# Patient Record
Sex: Male | Born: 1945
Health system: Southern US, Community
[De-identification: ages and names within clinical notes are randomized; demographics above are authoritative.]

## PROBLEM LIST (undated history)

## (undated) DIAGNOSIS — G44309 Post-traumatic headache, unspecified, not intractable: Secondary | ICD-10-CM

## (undated) DIAGNOSIS — S060X9A Concussion with loss of consciousness of unspecified duration, initial encounter: Secondary | ICD-10-CM

## (undated) DIAGNOSIS — S060XAA Concussion with loss of consciousness status unknown, initial encounter: Secondary | ICD-10-CM

## (undated) DIAGNOSIS — C801 Malignant (primary) neoplasm, unspecified: Secondary | ICD-10-CM

## (undated) DIAGNOSIS — F32A Depression, unspecified: Secondary | ICD-10-CM

## (undated) DIAGNOSIS — F0390 Unspecified dementia without behavioral disturbance: Secondary | ICD-10-CM

## (undated) DIAGNOSIS — F329 Major depressive disorder, single episode, unspecified: Secondary | ICD-10-CM

## (undated) DIAGNOSIS — R569 Unspecified convulsions: Secondary | ICD-10-CM

## (undated) HISTORY — PX: PROSTATECTOMY: SHX69

## (undated) HISTORY — DX: Unspecified convulsions: R56.9

## (undated) HISTORY — DX: Post-traumatic headache, unspecified, not intractable: G44.309

## (undated) HISTORY — DX: Concussion with loss of consciousness status unknown, initial encounter: S06.0XAA

## (undated) HISTORY — DX: Unspecified dementia, unspecified severity, without behavioral disturbance, psychotic disturbance, mood disturbance, and anxiety: F03.90

## (undated) HISTORY — DX: Malignant (primary) neoplasm, unspecified: C80.1

## (undated) HISTORY — DX: Concussion with loss of consciousness of unspecified duration, initial encounter: S06.0X9A

## (undated) HISTORY — PX: OTHER SURGICAL HISTORY: SHX169

---
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
## 2018-11-13 ENCOUNTER — Emergency Department (HOSPITAL_COMMUNITY)
Admission: EM | Admit: 2018-11-13 | Discharge: 2018-11-13 | Disposition: A | Discharge status: Home / Self Care | Payer: Medicare Other | Attending: Emergency Medicine | Admitting: Emergency Medicine

## 2018-11-13 ENCOUNTER — Emergency Department (HOSPITAL_COMMUNITY): Payer: Medicare Other

## 2018-11-13 ENCOUNTER — Encounter (HOSPITAL_COMMUNITY): Payer: Self-pay | Admitting: Emergency Medicine

## 2018-11-13 DIAGNOSIS — W0110XA Fall on same level from slipping, tripping and stumbling with subsequent striking against unspecified object, initial encounter: Secondary | ICD-10-CM | POA: Insufficient documentation

## 2018-11-13 DIAGNOSIS — Y999 Unspecified external cause status: Secondary | ICD-10-CM | POA: Diagnosis not present

## 2018-11-13 DIAGNOSIS — Y929 Unspecified place or not applicable: Secondary | ICD-10-CM | POA: Diagnosis not present

## 2018-11-13 DIAGNOSIS — W19XXXA Unspecified fall, initial encounter: Secondary | ICD-10-CM

## 2018-11-13 DIAGNOSIS — S0181XA Laceration without foreign body of other part of head, initial encounter: Secondary | ICD-10-CM | POA: Diagnosis not present

## 2018-11-13 DIAGNOSIS — Z23 Encounter for immunization: Secondary | ICD-10-CM | POA: Diagnosis not present

## 2018-11-13 DIAGNOSIS — Y939 Activity, unspecified: Secondary | ICD-10-CM | POA: Diagnosis not present

## 2018-11-13 DIAGNOSIS — S0990XA Unspecified injury of head, initial encounter: Secondary | ICD-10-CM | POA: Diagnosis present

## 2018-11-13 MED ORDER — TETANUS-DIPHTH-ACELL PERTUSSIS 5-2.5-18.5 LF-MCG/0.5 IM SUSP
0.5000 mL | Freq: Once | INTRAMUSCULAR | Status: AC
  Administered 2018-11-13: 0.5 mL via INTRAMUSCULAR
  Filled 2018-11-13: qty 0.5

## 2018-11-13 MED ORDER — LIDOCAINE HCL (PF) 1 % IJ SOLN
2.0000 mL | Freq: Once | INTRAMUSCULAR | Status: AC
  Administered 2018-11-13: 2 mL
  Filled 2018-11-13: qty 30

## 2018-11-13 NOTE — ED Provider Notes (Addendum)
[facility] [facility] DEPT Provider Note   CSN: [id number] Arrival date & time: 11/13/18  2133     History   Chief Complaint Chief Complaint  Patient presents with  . Fall  . Alcohol Intoxication    HPI Robert D Bozich [name]. is a 73 y.o. male.  The history is provided by the patient and medical records. No language interpreter was used.  Fall   Alcohol Intoxication    Robert D Anfinson [name]. is a 73 y.o. male who presents to the Emergency Department for evaluation after a fall just prior to arrival.  Patient states that today is the anniversary of his wife's passing.  He drank several glasses of Chardonnay and became unsteady on his feet.  He did fall and struck the side of his head.  He was wearing his glasses and believe that his glasses caused a laceration.  He is unsure of his last tetanus status.  Does not believe he lost consciousness.  No nausea or vomiting.  Denies any other injuries or arthralgias/myalgias.  No neck or back pain.  No numbness, tingling or weakness.  History reviewed. No pertinent past medical history.  There are no active problems to display for this patient.   History reviewed. No pertinent surgical history.      Home Medications    Prior to Admission medications   Not on File    Family History History reviewed. No pertinent family history.  Social History Social History   Tobacco Use  . Smoking status: Not on file  Substance Use Topics  . Alcohol use: Not on file  . Drug use: Not on file     Allergies   Patient has no allergy information on record.   Review of Systems Review of Systems  Skin: Positive for wound.  All other systems reviewed and are negative.    Physical Exam Updated Vital Signs BP 117/74 (BP Location: Right Arm)   Pulse 85   Temp 98.7 F (37.1 C) (Oral)   Resp 19   SpO2 93%   Physical Exam Vitals signs and nursing note reviewed.  Constitutional:      General: He is not in  acute distress.    Appearance: He is well-developed.  HENT:     Head: Normocephalic.     Comments: 1.5 cm laceration to the right temporal area Neck:     Comments: No midline tenderness. Cardiovascular:     Rate and Rhythm: Normal rate and regular rhythm.     Heart sounds: Normal heart sounds. No murmur.  Pulmonary:     Effort: Pulmonary effort is normal. No respiratory distress.     Breath sounds: Normal breath sounds.  Abdominal:     General: There is no distension.     Palpations: Abdomen is soft.     Tenderness: There is no abdominal tenderness.  Skin:    General: Skin is warm and dry.  Neurological:     Mental Status: He is alert and oriented to person, place, and time.     Comments: CN 2-12 grossly intact.  No drift. Strength and sensation intact.       ED Treatments / Results  Labs (all labs ordered are listed, but only abnormal results are displayed) Labs Reviewed - No data to display  EKG None  Radiology Ct Head Wo Contrast  Result Date: 11/13/2018 CLINICAL DATA:  73 year old who fell and struck his head on his front door, sustained a laceration to the forehead. No loss  of consciousness. Patient admits to 3 glasses of wine. Initial encounter. EXAM: CT HEAD WITHOUT CONTRAST CT CERVICAL SPINE WITHOUT CONTRAST TECHNIQUE: Multidetector CT imaging of the head and cervical spine was performed following the standard protocol without intravenous contrast. Multiplanar CT image reconstructions of the cervical spine were also generated. COMPARISON:  None. FINDINGS: CT HEAD FINDINGS Brain: Subcutaneous metallic device (electrodes?) in the bilateral frontal regions causes metallic beam hardening streak artifact over the frontal lobes. Mild age-appropriate cortical atrophy. Ventricular system normal in size and appearance for age. No mass lesion. No midline shift. No acute hemorrhage or hematoma. No extra-axial fluid collections. No evidence of acute infarction. Vascular: Minimal  BILATERAL carotid siphon atherosclerosis. No hyperdense vessel. Skull: Solitary mixed lucent and sclerotic lesion involving the RIGHT frontal bone. No skull fractures and no focal osseous abnormality elsewhere involving the skull. Sinuses/Orbits: Visualized paranasal sinuses, bilateral mastoid air cells and bilateral middle ear cavities well-aerated. Visualized orbits and globes normal in appearance. Other: None. CT CERVICAL SPINE FINDINGS Alignment: Anatomic alignment. Facet joints anatomically aligned throughout with diffuse degenerative changes. Skull base and vertebrae: No fractures identified involving the cervical spine. Coronal reformatted images demonstrate an intact craniocervical junction, intact dens and intact lateral masses throughout. Soft tissues and spinal canal: No evidence of paraspinous or spinal canal hematoma. No evidence of spinal stenosis. Disc levels: Moderate disc space narrowing at C5-6. Uncinate and facet hypertrophy account for multilevel foraminal stenoses including moderate LEFT C3-4, severe BILATERAL C4-5, severe BILATERAL C5-6, moderate LEFT C6-7. Upper chest: Visualized lung apices clear. Visualized superior mediastinum normal. Other: None. IMPRESSION: 1. No acute intracranial abnormality. 2. No cervical spine fractures identified. 3. Multilevel degenerative disc disease, spondylosis and facet degenerative changes with multilevel foraminal stenoses as detailed above. Electronically Signed   By: Evangeline Dakin M.D.   On: 11/13/2018 22:21   Ct Cervical Spine Wo Contrast  Result Date: 11/13/2018 CLINICAL DATA:  73 year old who fell and struck his head on his front door, sustained a laceration to the forehead. No loss of consciousness. Patient admits to 3 glasses of wine. Initial encounter. EXAM: CT HEAD WITHOUT CONTRAST CT CERVICAL SPINE WITHOUT CONTRAST TECHNIQUE: Multidetector CT imaging of the head and cervical spine was performed following the standard protocol without  intravenous contrast. Multiplanar CT image reconstructions of the cervical spine were also generated. COMPARISON:  None. FINDINGS: CT HEAD FINDINGS Brain: Subcutaneous metallic device (electrodes?) in the bilateral frontal regions causes metallic beam hardening streak artifact over the frontal lobes. Mild age-appropriate cortical atrophy. Ventricular system normal in size and appearance for age. No mass lesion. No midline shift. No acute hemorrhage or hematoma. No extra-axial fluid collections. No evidence of acute infarction. Vascular: Minimal BILATERAL carotid siphon atherosclerosis. No hyperdense vessel. Skull: Solitary mixed lucent and sclerotic lesion involving the RIGHT frontal bone. No skull fractures and no focal osseous abnormality elsewhere involving the skull. Sinuses/Orbits: Visualized paranasal sinuses, bilateral mastoid air cells and bilateral middle ear cavities well-aerated. Visualized orbits and globes normal in appearance. Other: None. CT CERVICAL SPINE FINDINGS Alignment: Anatomic alignment. Facet joints anatomically aligned throughout with diffuse degenerative changes. Skull base and vertebrae: No fractures identified involving the cervical spine. Coronal reformatted images demonstrate an intact craniocervical junction, intact dens and intact lateral masses throughout. Soft tissues and spinal canal: No evidence of paraspinous or spinal canal hematoma. No evidence of spinal stenosis. Disc levels: Moderate disc space narrowing at C5-6. Uncinate and facet hypertrophy account for multilevel foraminal stenoses including moderate LEFT C3-4, severe BILATERAL C4-5, severe BILATERAL  C5-6, moderate LEFT C6-7. Upper chest: Visualized lung apices clear. Visualized superior mediastinum normal. Other: None. IMPRESSION: 1. No acute intracranial abnormality. 2. No cervical spine fractures identified. 3. Multilevel degenerative disc disease, spondylosis and facet degenerative changes with multilevel foraminal  stenoses as detailed above. Electronically Signed   By: Evangeline Dakin M.D.   On: 11/13/2018 22:21    Procedures .Marland KitchenLaceration Repair Date/Time: 11/13/2018 11:09 PM Performed by: Ward, Ozella Almond, PA-C Authorized by: Ward, Ozella Almond, PA-C   Consent:    Consent obtained:  Verbal   Consent given by:  Patient   Risks discussed:  Pain, infection, poor cosmetic result and poor wound healing Anesthesia (see MAR for exact dosages):    Anesthesia method:  None Laceration details:    Location:  Face   Face location:  Forehead   Length (cm):  1.5 Repair type:    Repair type:  Simple Pre-procedure details:    Preparation:  Patient was prepped and draped in usual sterile fashion Exploration:    Hemostasis achieved with:  Direct pressure   Wound exploration: wound explored through full range of motion and entire depth of wound probed and visualized   Treatment:    Area cleansed with:  Saline   Amount of cleaning:  Standard   Irrigation solution:  Sterile saline Skin repair:    Repair method:  Sutures   Suture size:  5-0   Wound skin closure material used: Vicryl Rapide.   Suture technique:  Simple interrupted   Number of sutures:  2 Approximation:    Approximation:  Close Post-procedure details:    Dressing:  Open (no dressing)   Patient tolerance of procedure:  Tolerated well, no immediate complications   (including critical care time)  Medications Ordered in ED Medications  lidocaine (PF) (XYLOCAINE) 1 % injection 2 mL (has no administration in time range)  Tdap (BOOSTRIX) injection 0.5 mL (0.5 mLs Intramuscular Given 11/13/18 2225)     Initial Impression / Assessment and Plan / ED Course  I have reviewed the triage vital signs and the nursing notes.  Pertinent labs & imaging results that were available during my care of the patient were reviewed by me and considered in my medical decision making (see chart for details).    Robert D Whetsel [name]. is a 73 y.o. male  who presents to ED for valuation after fall secondary to alcohol abuse today.  No cranial nerve deficits.  CT head and cervical spine without acute findings.  Laceration repaired as dictated above.  Tetanus updated. Evaluation does not show pathology that would require ongoing emergent intervention or inpatient treatment. Home care instructions / return precautions / follow up care discussed and included in discharge summary. Brother at bedside who will drive patient home and stay with him tonight. All questions answered.   Patient seen by and discussed with Dr. Gilford Raid who agrees with treatment plan.    Final Clinical Impressions(s) / ED Diagnoses   Final diagnoses:  Fall, initial encounter  Injury of head, initial encounter  Facial laceration, initial encounter    ED Discharge Orders    None         Ward, Ozella Almond, PA-C 11/13/18 2311    Isla Pence, MD 11/14/18 1502

## 2018-11-13 NOTE — ED Notes (Signed)
Bed: WA03 Expected date:  Expected time:  Means of arrival:  Comments: EMS: 73 y/o fall, ETOH no blood thinners

## 2018-11-13 NOTE — Discharge Instructions (Signed)
It was my pleasure taking care of you today!   Fortunately, your images were reassuring with no signs of injury from your fall.  As we discussed, I have repaired your laceration with dissolvable stitches.  They should dissolve in the next 7 days.  If they do not, please take a warm washcloth and gently rub the area with Vaseline.  If they still have not come out and 7 to 10 days, please see your doctor/urgent care/emergency department for suture removal.  Keep the wound clean and dry.  Monitor for signs of infection such as redness around the site.  Return to ER for signs of infection to the wound, new or worsening symptoms, any additional concerns.

## 2018-11-13 NOTE — ED Triage Notes (Signed)
Per EMS , pt. From [facility] at [facility] with complaint of  etoh and fall at 0830 this evening  Pt. Admitted to drinking of 3 glasses of wine in celebration his anniversary. [place] in the front door as he opened the door for the med tech, witnessed. Acquired abrasion to right forehead ,no LOC. Second fall witnessed by EMS as pt. Putting on his pants in  front of them. Denied of blood thinner. A/O x4. c-collar in placed.

## 2019-06-18 ENCOUNTER — Emergency Department (HOSPITAL_COMMUNITY): Payer: Medicare Other

## 2019-06-18 ENCOUNTER — Inpatient Hospital Stay (HOSPITAL_COMMUNITY): Payer: Medicare Other

## 2019-06-18 ENCOUNTER — Encounter (HOSPITAL_COMMUNITY): Payer: Self-pay | Admitting: Emergency Medicine

## 2019-06-18 ENCOUNTER — Other Ambulatory Visit: Payer: Self-pay

## 2019-06-18 ENCOUNTER — Inpatient Hospital Stay (HOSPITAL_COMMUNITY)
Admission: EM | Admit: 2019-06-18 | Discharge: 2019-06-23 | DRG: 100 | Disposition: A | Discharge status: Home / Self Care | Payer: Medicare Other | Attending: Internal Medicine | Admitting: Internal Medicine

## 2019-06-18 DIAGNOSIS — R402132 Coma scale, eyes open, to sound, at arrival to emergency department: Secondary | ICD-10-CM | POA: Diagnosis present

## 2019-06-18 DIAGNOSIS — R402362 Coma scale, best motor response, obeys commands, at arrival to emergency department: Secondary | ICD-10-CM | POA: Diagnosis present

## 2019-06-18 DIAGNOSIS — F1010 Alcohol abuse, uncomplicated: Secondary | ICD-10-CM | POA: Diagnosis present

## 2019-06-18 DIAGNOSIS — D696 Thrombocytopenia, unspecified: Secondary | ICD-10-CM | POA: Diagnosis not present

## 2019-06-18 DIAGNOSIS — E876 Hypokalemia: Secondary | ICD-10-CM | POA: Diagnosis not present

## 2019-06-18 DIAGNOSIS — R402232 Coma scale, best verbal response, inappropriate words, at arrival to emergency department: Secondary | ICD-10-CM | POA: Diagnosis present

## 2019-06-18 DIAGNOSIS — J9602 Acute respiratory failure with hypercapnia: Secondary | ICD-10-CM | POA: Diagnosis present

## 2019-06-18 DIAGNOSIS — Z881 Allergy status to other antibiotic agents status: Secondary | ICD-10-CM | POA: Diagnosis not present

## 2019-06-18 DIAGNOSIS — Z79890 Hormone replacement therapy (postmenopausal): Secondary | ICD-10-CM

## 2019-06-18 DIAGNOSIS — Z7951 Long term (current) use of inhaled steroids: Secondary | ICD-10-CM | POA: Diagnosis not present

## 2019-06-18 DIAGNOSIS — E039 Hypothyroidism, unspecified: Secondary | ICD-10-CM | POA: Diagnosis present

## 2019-06-18 DIAGNOSIS — R55 Syncope and collapse: Secondary | ICD-10-CM | POA: Diagnosis not present

## 2019-06-18 DIAGNOSIS — Z87820 Personal history of traumatic brain injury: Secondary | ICD-10-CM | POA: Diagnosis not present

## 2019-06-18 DIAGNOSIS — G40901 Epilepsy, unspecified, not intractable, with status epilepticus: Secondary | ICD-10-CM | POA: Diagnosis present

## 2019-06-18 DIAGNOSIS — Z20828 Contact with and (suspected) exposure to other viral communicable diseases: Secondary | ICD-10-CM | POA: Diagnosis present

## 2019-06-18 DIAGNOSIS — E785 Hyperlipidemia, unspecified: Secondary | ICD-10-CM | POA: Diagnosis present

## 2019-06-18 DIAGNOSIS — F329 Major depressive disorder, single episode, unspecified: Secondary | ICD-10-CM | POA: Diagnosis present

## 2019-06-18 DIAGNOSIS — R569 Unspecified convulsions: Secondary | ICD-10-CM | POA: Diagnosis not present

## 2019-06-18 DIAGNOSIS — G40909 Epilepsy, unspecified, not intractable, without status epilepticus: Secondary | ICD-10-CM

## 2019-06-18 DIAGNOSIS — J9601 Acute respiratory failure with hypoxia: Secondary | ICD-10-CM | POA: Diagnosis present

## 2019-06-18 DIAGNOSIS — R4182 Altered mental status, unspecified: Secondary | ICD-10-CM | POA: Diagnosis present

## 2019-06-18 DIAGNOSIS — Z885 Allergy status to narcotic agent status: Secondary | ICD-10-CM

## 2019-06-18 DIAGNOSIS — G8929 Other chronic pain: Secondary | ICD-10-CM | POA: Diagnosis present

## 2019-06-18 DIAGNOSIS — Z4659 Encounter for fitting and adjustment of other gastrointestinal appliance and device: Secondary | ICD-10-CM

## 2019-06-18 HISTORY — DX: Depression, unspecified: F32.A

## 2019-06-18 LAB — GLUCOSE, CAPILLARY: Glucose-Capillary: 123 mg/dL — ABNORMAL HIGH (ref 70–99)

## 2019-06-18 LAB — CBC
HCT: 41.4 % (ref 39.0–52.0)
Hemoglobin: 14.5 g/dL (ref 13.0–17.0)
MCH: 33.6 pg (ref 26.0–34.0)
MCHC: 35 g/dL (ref 30.0–36.0)
MCV: 95.8 fL (ref 80.0–100.0)
Platelets: 168 10*3/uL (ref 150–400)
RBC: 4.32 MIL/uL (ref 4.22–5.81)
RDW: 12.1 % (ref 11.5–15.5)
WBC: 11.1 10*3/uL — ABNORMAL HIGH (ref 4.0–10.5)
nRBC: 0 % (ref 0.0–0.2)

## 2019-06-18 LAB — SALICYLATE LEVEL: Salicylate Lvl: <7 mg/dL (ref 2.8–30.0)

## 2019-06-18 LAB — POCT I-STAT 7, (LYTES, BLD GAS, ICA,H+H)
Acid-base deficit: 9 mmol/L — ABNORMAL HIGH (ref 0.0–2.0)
Acid-base deficit: 2 mmol/L (ref 0.0–2.0)
Bicarbonate: 20.1 mmol/L (ref 20.0–28.0)
Bicarbonate: 22.2 mmol/L (ref 20.0–28.0)
Calcium, Ion: 1.2 mmol/L (ref 1.15–1.40)
Calcium, Ion: 1.16 mmol/L (ref 1.15–1.40)
HCT: 43 % (ref 39.0–52.0)
HCT: 39 % (ref 39.0–52.0)
Hemoglobin: 14.6 g/dL (ref 13.0–17.0)
Hemoglobin: 13.3 g/dL (ref 13.0–17.0)
O2 Saturation: 96 %
O2 Saturation: 100 %
Patient temperature: 98.6
Patient temperature: 99.5
Potassium: 4.1 mmol/L (ref 3.5–5.1)
Potassium: 3.6 mmol/L (ref 3.5–5.1)
Sodium: 140 mmol/L (ref 135–145)
Sodium: 140 mmol/L (ref 135–145)
TCO2: 22 mmol/L (ref 22–32)
TCO2: 23 mmol/L (ref 22–32)
pCO2 arterial: 57.9 mmHg — ABNORMAL HIGH (ref 32.0–48.0)
pCO2 arterial: 37.6 mmHg (ref 32.0–48.0)
pH, Arterial: 7.149 — CL (ref 7.350–7.450)
pH, Arterial: 7.382 (ref 7.350–7.450)
pO2, Arterial: 107 mmHg (ref 83.0–108.0)
pO2, Arterial: 223 mmHg — ABNORMAL HIGH (ref 83.0–108.0)

## 2019-06-18 LAB — CBC WITH DIFFERENTIAL/PLATELET
Abs Immature Granulocytes: 0.1 10*3/uL — ABNORMAL HIGH (ref 0.00–0.07)
Basophils Absolute: 0.1 10*3/uL (ref 0.0–0.1)
Basophils Relative: 1 %
Eosinophils Absolute: 0.1 10*3/uL (ref 0.0–0.5)
Eosinophils Relative: 1 %
HCT: 45.2 % (ref 39.0–52.0)
Hemoglobin: 15.4 g/dL (ref 13.0–17.0)
Immature Granulocytes: 1 %
Lymphocytes Relative: 35 %
Lymphs Abs: 2.8 10*3/uL (ref 0.7–4.0)
MCH: 33.3 pg (ref 26.0–34.0)
MCHC: 34.1 g/dL (ref 30.0–36.0)
MCV: 97.8 fL (ref 80.0–100.0)
Monocytes Absolute: 0.8 10*3/uL (ref 0.1–1.0)
Monocytes Relative: 10 %
Neutro Abs: 4 10*3/uL (ref 1.7–7.7)
Neutrophils Relative %: 52 %
Platelets: 196 10*3/uL (ref 150–400)
RBC: 4.62 MIL/uL (ref 4.22–5.81)
RDW: 12.1 % (ref 11.5–15.5)
WBC: 7.9 10*3/uL (ref 4.0–10.5)
nRBC: 0 % (ref 0.0–0.2)

## 2019-06-18 LAB — POCT I-STAT EG7
Acid-base deficit: 5 mmol/L — ABNORMAL HIGH (ref 0.0–2.0)
Bicarbonate: 19.3 mmol/L — ABNORMAL LOW (ref 20.0–28.0)
Calcium, Ion: 1.1 mmol/L — ABNORMAL LOW (ref 1.15–1.40)
HCT: 44 % (ref 39.0–52.0)
Hemoglobin: 15 g/dL (ref 13.0–17.0)
O2 Saturation: 95 %
Potassium: 4.1 mmol/L (ref 3.5–5.1)
Sodium: 139 mmol/L (ref 135–145)
TCO2: 20 mmol/L — ABNORMAL LOW (ref 22–32)
pCO2, Ven: 32.5 mmHg — ABNORMAL LOW (ref 44.0–60.0)
pH, Ven: 7.381 (ref 7.250–7.430)
pO2, Ven: 79 mmHg — ABNORMAL HIGH (ref 32.0–45.0)

## 2019-06-18 LAB — URINALYSIS, ROUTINE W REFLEX MICROSCOPIC
Bilirubin Urine: NEGATIVE
Glucose, UA: NEGATIVE mg/dL
Ketones, ur: NEGATIVE mg/dL
Leukocytes,Ua: NEGATIVE
Nitrite: NEGATIVE
Protein, ur: 100 mg/dL — AB
Specific Gravity, Urine: 1.013 (ref 1.005–1.030)
pH: 5 (ref 5.0–8.0)

## 2019-06-18 LAB — CBG MONITORING, ED: Glucose-Capillary: 111 mg/dL — ABNORMAL HIGH (ref 70–99)

## 2019-06-18 LAB — COMPREHENSIVE METABOLIC PANEL
ALT: 33 U/L (ref 0–44)
AST: 44 U/L — ABNORMAL HIGH (ref 15–41)
Albumin: 4.2 g/dL (ref 3.5–5.0)
Alkaline Phosphatase: 74 U/L (ref 38–126)
Anion gap: 16 — ABNORMAL HIGH (ref 5–15)
BUN: 14 mg/dL (ref 8–23)
CO2: 18 mmol/L — ABNORMAL LOW (ref 22–32)
Calcium: 9 mg/dL (ref 8.9–10.3)
Chloride: 103 mmol/L (ref 98–111)
Creatinine, Ser: 1.18 mg/dL (ref 0.61–1.24)
GFR calc Af Amer: >60 mL/min (ref >60)
GFR calc non Af Amer: >60 mL/min (ref >60)
Glucose, Bld: 131 mg/dL — ABNORMAL HIGH (ref 70–99)
Potassium: 4.1 mmol/L (ref 3.5–5.1)
Sodium: 137 mmol/L (ref 135–145)
Total Bilirubin: 0.9 mg/dL (ref 0.3–1.2)
Total Protein: 6.8 g/dL (ref 6.5–8.1)

## 2019-06-18 LAB — LIPASE, BLOOD: Lipase: 40 U/L (ref 11–51)

## 2019-06-18 LAB — RAPID URINE DRUG SCREEN, HOSP PERFORMED
Amphetamines: NOT DETECTED
Barbiturates: NOT DETECTED
Benzodiazepines: NOT DETECTED
Cocaine: NOT DETECTED
Opiates: NOT DETECTED
Tetrahydrocannabinol: NOT DETECTED

## 2019-06-18 LAB — ACETAMINOPHEN LEVEL: Acetaminophen (Tylenol), Serum: <10 ug/mL — ABNORMAL LOW (ref 10–30)

## 2019-06-18 LAB — SARS CORONAVIRUS 2 BY RT PCR (HOSPITAL ORDER, PERFORMED IN ~~LOC~~ HOSPITAL LAB): SARS Coronavirus 2: NEGATIVE

## 2019-06-18 LAB — PROTIME-INR
INR: 1 (ref 0.8–1.2)
Prothrombin Time: 13 seconds (ref 11.4–15.2)

## 2019-06-18 LAB — PROCALCITONIN: Procalcitonin: <0.1 ng/mL

## 2019-06-18 LAB — MAGNESIUM: Magnesium: 2.3 mg/dL (ref 1.7–2.4)

## 2019-06-18 LAB — APTT: aPTT: 29 seconds (ref 24–36)

## 2019-06-18 LAB — ETHANOL: Alcohol, Ethyl (B): <10 mg/dL (ref <10)

## 2019-06-18 LAB — PHOSPHORUS: Phosphorus: 2.2 mg/dL — ABNORMAL LOW (ref 2.5–4.6)

## 2019-06-18 LAB — TROPONIN I (HIGH SENSITIVITY)
Troponin I (High Sensitivity): 6 ng/L (ref <18)
Troponin I (High Sensitivity): 49 ng/L — ABNORMAL HIGH (ref <18)

## 2019-06-18 LAB — TSH: TSH: 3.129 u[IU]/mL (ref 0.350–4.500)

## 2019-06-18 LAB — LACTIC ACID, PLASMA: Lactic Acid, Venous: 2.6 mmol/L (ref 0.5–1.9)

## 2019-06-18 MED ORDER — ETOMIDATE 2 MG/ML IV SOLN
INTRAVENOUS | Status: AC | PRN
  Administered 2019-06-18: 20 mg via INTRAVENOUS

## 2019-06-18 MED ORDER — ROCURONIUM BROMIDE 50 MG/5ML IV SOLN
INTRAVENOUS | Status: AC | PRN
  Administered 2019-06-18: 90 mg via INTRAVENOUS

## 2019-06-18 MED ORDER — FENTANYL BOLUS VIA INFUSION
25.0000 ug | INTRAVENOUS | Status: DC | PRN
  Filled 2019-06-18: qty 25

## 2019-06-18 MED ORDER — TIMOLOL MALEATE 0.5 % OP SOLN
1.0000 [drp] | Freq: Two times a day (BID) | OPHTHALMIC | Status: DC
  Administered 2019-06-18 – 2019-06-23 (×9): 1 [drp] via OPHTHALMIC
  Filled 2019-06-18 (×2): qty 5

## 2019-06-18 MED ORDER — LEVOTHYROXINE SODIUM 75 MCG PO TABS
37.5000 ug | ORAL_TABLET | Freq: Every day | ORAL | Status: DC
  Administered 2019-06-19 – 2019-06-23 (×3): 37.5 ug via ORAL
  Filled 2019-06-18 (×3): qty 1

## 2019-06-18 MED ORDER — FENTANYL CITRATE (PF) 100 MCG/2ML IJ SOLN: 25.0000 ug | Freq: Once | INTRAMUSCULAR | Status: DC

## 2019-06-18 MED ORDER — ORAL CARE MOUTH RINSE
15.0000 mL | OROMUCOSAL | Status: DC
  Administered 2019-06-18 – 2019-06-20 (×19): 15 mL via OROMUCOSAL

## 2019-06-18 MED ORDER — MIDAZOLAM HCL 2 MG/2ML IJ SOLN
1.0000 mg | INTRAMUSCULAR | Status: DC | PRN
  Administered 2019-06-20: 1 mg via INTRAVENOUS
  Filled 2019-06-18: qty 2

## 2019-06-18 MED ORDER — FOLIC ACID 5 MG/ML IJ SOLN
1.0000 mg | Freq: Every day | INTRAMUSCULAR | Status: AC
  Administered 2019-06-18 – 2019-06-20 (×3): 1 mg via INTRAVENOUS
  Filled 2019-06-18 (×3): qty 0.2

## 2019-06-18 MED ORDER — SODIUM CHLORIDE 0.9 % IV BOLUS
1000.0000 mL | Freq: Once | INTRAVENOUS | Status: AC
  Administered 2019-06-18: 15:00:00 1000 mL via INTRAVENOUS

## 2019-06-18 MED ORDER — ATORVASTATIN CALCIUM 10 MG PO TABS
20.0000 mg | ORAL_TABLET | Freq: Every day | ORAL | Status: DC
  Administered 2019-06-18 – 2019-06-21 (×3): 20 mg
  Filled 2019-06-18 (×3): qty 2

## 2019-06-18 MED ORDER — SODIUM CHLORIDE 0.9 % IV SOLN: 1.5000 g | Freq: Three times a day (TID) | INTRAVENOUS | Status: DC

## 2019-06-18 MED ORDER — BRIMONIDINE TARTRATE 0.2 % OP SOLN
1.0000 [drp] | Freq: Two times a day (BID) | OPHTHALMIC | Status: DC
  Administered 2019-06-18 – 2019-06-23 (×9): 1 [drp] via OPHTHALMIC
  Filled 2019-06-18 (×2): qty 5

## 2019-06-18 MED ORDER — LEVETIRACETAM IN NACL 500 MG/100ML IV SOLN
500.0000 mg | Freq: Two times a day (BID) | INTRAVENOUS | Status: DC
  Administered 2019-06-19 – 2019-06-23 (×9): 500 mg via INTRAVENOUS
  Filled 2019-06-18 (×9): qty 100

## 2019-06-18 MED ORDER — ENOXAPARIN SODIUM 40 MG/0.4ML ~~LOC~~ SOLN
40.0000 mg | SUBCUTANEOUS | Status: DC
  Administered 2019-06-18 – 2019-06-22 (×5): 40 mg via SUBCUTANEOUS
  Filled 2019-06-18 (×5): qty 0.4

## 2019-06-18 MED ORDER — LORAZEPAM 2 MG/ML IJ SOLN
INTRAMUSCULAR | Status: AC
  Administered 2019-06-18: 15:00:00
  Filled 2019-06-18: qty 1

## 2019-06-18 MED ORDER — MIDAZOLAM HCL 2 MG/2ML IJ SOLN: 1.0000 mg | INTRAMUSCULAR | Status: DC | PRN

## 2019-06-18 MED ORDER — PROPOFOL 1000 MG/100ML IV EMUL
INTRAVENOUS | Status: AC
  Filled 2019-06-18: qty 100

## 2019-06-18 MED ORDER — SODIUM CHLORIDE 0.9 % IV SOLN
2000.0000 mg | INTRAVENOUS | Status: AC
  Administered 2019-06-18: 18:00:00 2000 mg via INTRAVENOUS
  Filled 2019-06-18: qty 20

## 2019-06-18 MED ORDER — ONDANSETRON HCL 4 MG/2ML IJ SOLN
4.0000 mg | Freq: Once | INTRAMUSCULAR | Status: AC
  Administered 2019-06-18: 4 mg via INTRAVENOUS
  Filled 2019-06-18: qty 2

## 2019-06-18 MED ORDER — ATORVASTATIN CALCIUM 10 MG PO TABS: 20.0000 mg | ORAL_TABLET | Freq: Every day | ORAL | Status: DC

## 2019-06-18 MED ORDER — MONTELUKAST SODIUM 10 MG PO TABS
10.0000 mg | ORAL_TABLET | Freq: Every day | ORAL | Status: DC
  Administered 2019-06-18: 10 mg via ORAL
  Filled 2019-06-18: qty 1

## 2019-06-18 MED ORDER — CHLORHEXIDINE GLUCONATE 0.12% ORAL RINSE (MEDLINE KIT)
15.0000 mL | Freq: Two times a day (BID) | OROMUCOSAL | Status: DC
  Administered 2019-06-18 – 2019-06-20 (×4): 15 mL via OROMUCOSAL

## 2019-06-18 MED ORDER — FENTANYL 2500MCG IN NS 250ML (10MCG/ML) PREMIX INFUSION
25.0000 ug/h | INTRAVENOUS | Status: DC
  Administered 2019-06-18: 50 ug/h via INTRAVENOUS
  Administered 2019-06-19: 75 ug/h via INTRAVENOUS
  Filled 2019-06-18 (×2): qty 250

## 2019-06-18 MED ORDER — SODIUM CHLORIDE 0.9 % IV SOLN
3.0000 g | Freq: Four times a day (QID) | INTRAVENOUS | Status: DC
  Administered 2019-06-18 – 2019-06-20 (×8): 3 g via INTRAVENOUS
  Filled 2019-06-18 (×2): qty 8
  Filled 2019-06-18 (×5): qty 3
  Filled 2019-06-18: qty 8
  Filled 2019-06-18: qty 3

## 2019-06-18 MED ORDER — FAMOTIDINE 40 MG/5ML PO SUSR
20.0000 mg | Freq: Two times a day (BID) | ORAL | Status: DC
  Administered 2019-06-18 – 2019-06-20 (×4): 20 mg
  Filled 2019-06-18 (×5): qty 2.5

## 2019-06-18 MED ORDER — ADULT MULTIVITAMIN LIQUID CH
15.0000 mL | Freq: Every day | ORAL | Status: DC
  Administered 2019-06-18 – 2019-06-20 (×3): 15 mL via ORAL
  Filled 2019-06-18 (×3): qty 15

## 2019-06-18 MED ORDER — PROPOFOL 1000 MG/100ML IV EMUL
5.0000 ug/kg/min | INTRAVENOUS | Status: DC
  Administered 2019-06-18: 18:00:00 60 ug/kg/min via INTRAVENOUS
  Administered 2019-06-18: 23:00:00 30 ug/kg/min via INTRAVENOUS
  Administered 2019-06-18: 15:00:00 10 ug/kg/min via INTRAVENOUS
  Administered 2019-06-19 (×2): 60 ug/kg/min via INTRAVENOUS
  Administered 2019-06-19: 55 ug/kg/min via INTRAVENOUS
  Administered 2019-06-20 (×2): 60 ug/kg/min via INTRAVENOUS
  Filled 2019-06-18: qty 200
  Filled 2019-06-18 (×5): qty 100
  Filled 2019-06-18: qty 200
  Filled 2019-06-18 (×2): qty 100

## 2019-06-18 MED ORDER — THIAMINE HCL 100 MG/ML IJ SOLN
100.0000 mg | INTRAMUSCULAR | Status: AC
  Administered 2019-06-19 – 2019-06-20 (×2): 100 mg via INTRAVENOUS
  Filled 2019-06-18 (×3): qty 2

## 2019-06-18 NOTE — ED Triage Notes (Signed)
Pt arrives via EMS from [facility] with reports of possible seizure today. Staff reports pt became gray and right arm stiffened. Facility reports pt was normal this AM for breakfast. Pt altered and startled when touched. EMS reports pt has been having HA the past few days but pt denies HA at this time.

## 2019-06-18 NOTE — Progress Notes (Signed)
Pharmacy Antibiotic Note  Robert Cummings. is a 73 y.o. male admitted on 06/18/2019 with aspiration PNA.  Pharmacy has been consulted for Unasyn dosing. Scr 1.18 on admit. Noted hx of "unspecified" reaction to Keflex, brother reportedly could not confirm allergy. Ok to trial Unasyn per discussion with Dr. Valeta Harms.  Plan: Unasyn 3g IV q6h Monitor clinical progress, c/s, renal function F/u de-escalation plan/LOT   Height: 5\' 8"  (172.7 cm) Weight: 176 lb 14.4 oz (80.2 kg) IBW/kg (Calculated) : 68.4  Temp (24hrs), Avg:97.5 F (36.4 C), Min:97.5 F (36.4 C), Max:97.5 F (36.4 C)  Recent Labs  Lab 06/18/19 1425  WBC 7.9  CREATININE 1.18    Estimated Creatinine Clearance: 55.6 mL/min (by C-G formula based on SCr of 1.18 mg/dL).    Allergies  Allergen Reactions  . Cephalexin Other (See Comments)    Brother cannot confirm  . Ciprofloxacin Other (See Comments)    Brother cannot confirm  . Codeine Other (See Comments)    Brother cannot confirm  . Other Other (See Comments)    Ragweed- Runny nose, itchy eyes, and congestion also    Elicia Lamp, PharmD, BCPS Please check AMION for all [facility] contact numbers Clinical Pharmacist 06/18/2019 4:45 PM

## 2019-06-18 NOTE — Procedures (Addendum)
ELECTROENCEPHALOGRAM REPORT   Patient: Robert Cummings.       Room #: 4N21C EEG No. ID: [id number] Age: 73 y.o.        Sex: male Referring Physician: Halford Chessman Report Date:  06/18/2019        Interpreting Physician: Alexis Goodell  History: Robert Cummings Robert Cummings. is an 73 y.o. male with sudden loss of consciousness and right arm shaking  Medications:  Propofol, Keppra, Synthroid, Lipitor, Unasyn, Fentanyl, Singulair, MVI  Conditions of Recording:  This is a 21 channel routine scalp EEG performed with bipolar and monopolar montages arranged in accordance to the international 10/20 system of electrode placement. One channel was dedicated to EKG recording.  The patient is in the sedated state.  There is a stimulator located at the forehead.  Description:  The background activity is slow and poorly organized.  It consists of a mixture of theta and delta activity that is low voltage.  Some occasional intermixed alpha activity is noted as well.  There are bursts of beta activity that occur intermittently over the left hemisphere. The patient has some clinical episodes of right hand shaking lasting a few seconds each.  These do not correspond to the episodes of beta activity which are likely artifact from the implanted stimulator.  The patient has noxious stimuli applied.  There is activation of the background with this stimulation.   Hyperventilation and intermittent photic stimulation were not performed.   IMPRESSION: This is an abnormal EEG secondary to general background slowing.  This finding may be seen with a diffuse disturbance that is etiologically nonspecific, but may include a metabolic encephalopathy, among other possibilities.  There are bursts of beta activity over the left hemisphere that may very well be artifactual and related to the patient's stimulator.     Alexis Goodell, MD Neurology [phone] 06/18/2019, 5:44 PM

## 2019-06-18 NOTE — H&P (Signed)
NAME:  Robert Cummings, [place] MRN:  [id number], DOB:  May 27, 1946, LOS: 0 ADMISSION DATE:  06/18/2019, CONSULTATION DATE:  06/18/2019 REFERRING MD:  Dr. Billy Fischer, ER, CHIEF COMPLAINT: seizure  Brief History   73 yo male from [place] with seizure.  Required intubation for airway protection in ER.  Had c/o headache recently.  Hx from chart and ER staff.  Pt unable to provide Hx.  Past Medical History  HLD, Allergies, Hypothyroidism, Depression  Significant Hospital Events   8/15 Admit  Consults:  Neurology 8/15 seizure  Procedures:  ETT 8/15 >>   Significant Diagnostic Tests:  CT head 8/15 >> no acute findings  Micro Data:  COVID 8/15 >> negative Sputum 8/15 >>   Antimicrobials:  Unasyn 8/15 >>   Interim history/subjective:    Objective   Blood pressure 118/86, pulse (!) 113, temperature (!) 97.5 F (36.4 C), temperature source Temporal, resp. rate 18, height 5\' 8"  (1.727 m), weight 80.2 kg, SpO2 97 %.    Vent Mode: PRVC FiO2 (%):  [100 %] 100 % Set Rate:  [16 bmp-24 bmp] 24 bmp Vt Set:  [540 mL] 540 mL PEEP:  [10 cmH20] 10 cmH20 Plateau Pressure:  [19 cmH20-21 cmH20] 21 cmH20   Intake/Output Summary (Last 24 hours) at 06/18/2019 1633 Last data filed at 06/18/2019 1530 Gross per 24 hour  Intake 1000 ml  Output -  Net 1000 ml   Filed Weights   06/18/19 1408  Weight: 80.2 kg    Examination:  General - sedated Eyes - pupils reactive ENT - ETT in place Cardiac - regular, tachycardic, no murmur Chest - b/l crackles Abdomen - soft, non tender, + bowel sounds Extremities - no cyanosis, clubbing, or edema Skin - no rashes Neuro - RASS -2, subcutaneous stimulator in forehead  CXR - low lung volumes, elevated Rt diaphragm, b/l ASD/ATX (reviewed by me)   Resolved Hospital Problem list     Assessment & Plan:   Acute hypoxic, hypercapnic respiratory failure with compromised airway. - full vent support - adjust PEEP/FiO2 to keep  SpO2 > 92% - f/u CXR  Acute metabolic encephalopathy. Seizure with reported hx of ETOH. - neurology consulted - f/u EEG - continue keppra - RASS goal -1 - thiamine, folic acid  Aspiration pneumonitis. - f/u sputum culture - start unasyn  Hx of HLD. - continue lipitor  Hx of hypothyroidism. - continue synthroid - check TSH  Hx of depression. - hold outpt zoloft  Hx of allergies. - continue singulair - hold zyrtec  Best practice:  Diet: NPO DVT prophylaxis: lovenox GI prophylaxis: pepcid Mobility: bed rest Code Status: full code Disposition: ICU  Labs   CBC: Recent Labs  Lab 06/18/19 1425 06/18/19 1435 06/18/19 1544  WBC 7.9  --   --   NEUTROABS 4.0  --   --   HGB 15.4 15.0 14.6  HCT 45.2 44.0 43.0  MCV 97.8  --   --   PLT 196  --   --     Basic Metabolic Panel: Recent Labs  Lab 06/18/19 1425 06/18/19 1435 06/18/19 1544  NA 137 139 140  K 4.1 4.1 4.1  CL 103  --   --   CO2 18*  --   --   GLUCOSE 131*  --   --   BUN 14  --   --   CREATININE 1.18  --   --   CALCIUM 9.0  --   --  GFR: Estimated Creatinine Clearance: 55.6 mL/min (by C-G formula based on SCr of 1.18 mg/dL). Recent Labs  Lab 06/18/19 1425  WBC 7.9    Liver Function Tests: Recent Labs  Lab 06/18/19 1425  AST 44*  ALT 33  ALKPHOS 74  BILITOT 0.9  PROT 6.8  ALBUMIN 4.2   Recent Labs  Lab 06/18/19 1425  LIPASE 40   No results for input(s): AMMONIA in the last 168 hours.  ABG    Component Value Date/Time   PHART 7.149 (LL) 06/18/2019 1544   PCO2ART 57.9 (H) 06/18/2019 1544   PO2ART 107.0 06/18/2019 1544   HCO3 20.1 06/18/2019 1544   TCO2 22 06/18/2019 1544   ACIDBASEDEF 9.0 (H) 06/18/2019 1544   O2SAT 96.0 06/18/2019 1544     Coagulation Profile: No results for input(s): INR, PROTIME in the last 168 hours.  Cardiac Enzymes: No results for input(s): CKTOTAL, CKMB, CKMBINDEX, TROPONINI in the last 168 hours.  HbA1C: No results found for: HGBA1C  CBG:  Recent Labs  Lab 06/18/19 1408  GLUCAP 111*    Review of Systems:   Unable to obtain  Past Medical History  He,  has a past medical history of Depression.   Surgical History   Unable to obtain  Social History  Unable to obtain  Family History   Unable to obtain  Allergies Allergies  Allergen Reactions  . Cephalexin Other (See Comments)    Brother cannot confirm  . Ciprofloxacin Other (See Comments)    Brother cannot confirm  . Codeine Other (See Comments)    Brother cannot confirm  . Other Other (See Comments)    Ragweed- Runny nose, itchy eyes, and congestion also     Home Medications  Prior to Admission medications   Medication Sig Start Date End Date Taking? Authorizing Provider  acetaminophen (MAPAP) 325 MG tablet Take 650 mg by mouth daily.   Yes [provider]  brimonidine (ALPHAGAN) 0.2 % ophthalmic solution Place 1 drop into both eyes 2 (two) times daily. 05/27/19  Yes [provider]  levothyroxine (SYNTHROID) 25 MCG tablet Take 37.5 mcg by mouth daily before breakfast.   Yes [provider]  loperamide (IMODIUM A-D) 2 MG tablet Take 2 mg by mouth 4 (four) times daily as needed for diarrhea or loose stools.   Yes [provider]  Multiple Vitamin (MULTIVITAMIN WITH MINERALS) TABS tablet Take 1 tablet by mouth daily.   Yes [provider]  ondansetron (ZOFRAN) 4 MG tablet Take 4 mg by mouth every 8 (eight) hours as needed for nausea or vomiting.   Yes [provider]  timolol (TIMOPTIC) 0.5 % ophthalmic solution Place 1 drop into both eyes 2 (two) times daily. 12/21/17  Yes [provider]  traZODone (DESYREL) 50 MG tablet Take 25 mg by mouth at bedtime.   Yes [provider]  valACYclovir (VALTREX) 500 MG tablet Take 500 mg by mouth daily.   Yes [provider]  sertraline (ZOLOFT) 50 MG tablet Take 75 mg by mouth every morning. 03/22/19   [provider]     Critical  care time: 75 minutes    Chesley Mires, MD [place] 06/18/2019, 4:49 PM

## 2019-06-18 NOTE — ED Provider Notes (Addendum)
°[facility] EMERGENCY DEPARTMENT °Provider Note ° ° °CSN: [id number] °Arrival date & time: 06/18/19  1358 ° ° ° °History   °Chief Complaint °Chief Complaint  °Patient presents with  °• Altered Mental Status  ° ° °HPI °Gradie D Fuster Jr. is a 73 y.o. male. ° °  ° °HPI  ° °73yo male with history of depression presents from [facility] who presents with concern for episode of syncope with possible seizure-like activity.  Per facility, patient appeared normal at breakfast, then was seen walking down the hallway and was seen with right side stiffening, turning purple and falling to the ground.  Report he was out. On EMS arrival he was awake and following commands without signs of focal deficit, was startingling easily and altered.  Glucose and VS normal. ° °Patient answering questions.Per facility, he had been complaining of headache and per EMS had one episode of emesis. Pt reports no headache now, no cough, no fever, no chest pain or dyspnea.  ° ° °Per brother from hx from Dr. Arora, pt does have hx of etoh use, prior seizure and has device in forehead [employer] for "monitoring"/chronic pain. Is on chronic pain regimen.  ° °Past Medical History:  °Diagnosis Date  °• Depression   ° ° °Patient Active Problem List  ° Diagnosis Date Noted  °• Seizure (HCC) 06/18/2019  ° ° °No past surgical history on file.  ° ° ° ° °Home Medications   ° °Prior to Admission medications   °Medication Sig Start Date End Date Taking? Authorizing Provider  °acetaminophen (MAPAP) 325 MG tablet Take 650 mg by mouth daily.   Yes [provider]  °atorvastatin (LIPITOR) 20 MG tablet Take 20 mg by mouth at bedtime.   Yes [provider]  °Azelastine HCl 0.15 % SOLN Place 2 sprays into both nostrils 2 (two) times daily.  05/25/19  Yes [provider]  °brimonidine (ALPHAGAN) 0.2 % ophthalmic solution Place 1 drop into both eyes 2 (two) times daily. 05/27/19  Yes [provider]  °cetirizine (ZYRTEC) 10 MG tablet Take 10 mg by mouth daily.   Yes [provider]  °fluticasone (FLONASE) 50 MCG/ACT nasal spray Place 2 sprays into both nostrils daily.   Yes [provider]  °levothyroxine (SYNTHROID) 25 MCG tablet Take 37.5 mcg by mouth daily before breakfast.   Yes [provider]  °loperamide (IMODIUM A-D) 2 MG tablet Take 2 mg by mouth 4 (four) times daily as needed for diarrhea or loose stools.   Yes [provider]  °montelukast (SINGULAIR) 10 MG tablet Take 10 mg by mouth at bedtime.   Yes [provider]  °Multiple Vitamin (MULTIVITAMIN WITH MINERALS) TABS tablet Take 1 tablet by mouth daily.   Yes [provider]  °ondansetron (ZOFRAN) 4 MG tablet Take 4 mg by mouth every 8 (eight) hours as needed for nausea or vomiting.   Yes [provider]  °sertraline (ZOLOFT) 50 MG tablet Take 75 mg by mouth every morning. 03/22/19  Yes [provider]  °timolol (TIMOPTIC) 0.5 % ophthalmic solution Place 1 drop into both eyes 2 (two) times daily. 12/21/17  Yes [provider]  °traZODone (DESYREL) 50 MG tablet Take 25 mg by mouth at bedtime.   Yes [provider]  °valACYclovir (VALTREX) 500 MG tablet Take 500 mg by mouth daily.   Yes [provider]  ° ° °Family History °No family history on file. ° °Social History °Social History  ° °  Tobacco Use  °• Smoking status: Not on file  °Substance Use Topics  °• Alcohol use: Not on file  °• Drug use: Not on file  ° ° ° °Allergies   °Cephalexin, Ciprofloxacin, Codeine, and Other ° ° °Review of Systems °Review of Systems  °Unable to perform ROS: Mental status change  °Constitutional: Negative for fever.  °Respiratory: Negative for cough and shortness of breath.   °Gastrointestinal: Positive for nausea and vomiting.  °Neurological: Positive for seizures and headaches.  ° ° ° °Physical Exam °Updated Vital Signs °BP 123/77    Pulse 97    Temp 98.5 °F  (36.9 °C) (Axillary)    Resp (!) 24    Ht 5' 8" (1.727 m)    Wt 80.2 kg    SpO2 98%    BMI 26.90 kg/m²  ° °Physical Exam °Vitals signs and nursing note reviewed.  °Constitutional:   °   General: He is not in acute distress. °   Appearance: He is well-developed. He is not diaphoretic.  °HENT:  °   Head: Normocephalic and atraumatic.  °Eyes:  °   Extraocular Movements: Extraocular movements intact.  °   Conjunctiva/sclera: Conjunctivae normal.  °   Pupils: Pupils are equal, round, and reactive to light.  °Neck:  °   Musculoskeletal: Normal range of motion.  °Cardiovascular:  °   Rate and Rhythm: Normal rate and regular rhythm.  °   Heart sounds: Normal heart sounds. No murmur. No friction rub. No gallop.   °Pulmonary:  °   Effort: Pulmonary effort is normal. No respiratory distress.  °   Breath sounds: Normal breath sounds. No wheezing or rales.  °Abdominal:  °   General: There is no distension.  °   Palpations: Abdomen is soft.  °   Tenderness: There is no abdominal tenderness. There is no guarding.  °Skin: °   General: Skin is warm and dry.  °Neurological:  °   Mental Status: He is alert.  °   GCS: GCS eye subscore is 4. GCS verbal subscore is 5. GCS motor subscore is 6.  °   Cranial Nerves: No cranial nerve deficit, dysarthria or facial asymmetry.  °   Sensory: Sensation is intact.  °   Motor: Motor function is intact. No weakness or pronator drift.  °   Coordination: Coordination normal.  °   Comments: Oriented to self-"says Denn Sabater" °Intermittently answering questions and following commands °Normal bilateral upper and lower extremity strength °Difficulty understanding finger to nose but able to perform °Has right sided gaze preference but able to look towards left. When asked if touching right or left he does not state name but will shake the correct leg in response.   °Startles easily when asking questions  ° ° ° ° °ED Treatments / Results  °Labs °(all labs ordered are listed, but only abnormal results  are displayed) °Labs Reviewed  °CBC WITH DIFFERENTIAL/PLATELET - Abnormal; Notable for the following components:  °    Result Value  ° Abs Immature Granulocytes 0.10 (*)   ° All other components within normal limits  °COMPREHENSIVE METABOLIC PANEL - Abnormal; Notable for the following components:  ° CO2 18 (*)   ° Glucose, Bld 131 (*)   ° AST 44 (*)   ° Anion gap 16 (*)   ° All other components within normal limits  °ACETAMINOPHEN LEVEL - Abnormal; Notable for the following components:  ° Acetaminophen (Tylenol), Serum <10 (*)   ° All other   components within normal limits  °URINALYSIS, ROUTINE W REFLEX MICROSCOPIC - Abnormal; Notable for the following components:  ° Hgb urine dipstick MODERATE (*)   ° Protein, ur 100 (*)   ° Bacteria, UA RARE (*)   ° All other components within normal limits  °CBC - Abnormal; Notable for the following components:  ° WBC 11.1 (*)   ° All other components within normal limits  °PHOSPHORUS - Abnormal; Notable for the following components:  ° Phosphorus 2.2 (*)   ° All other components within normal limits  °LACTIC ACID, PLASMA - Abnormal; Notable for the following components:  ° Lactic Acid, Venous 2.6 (*)   ° All other components within normal limits  °GLUCOSE, CAPILLARY - Abnormal; Notable for the following components:  ° Glucose-Capillary 123 (*)   ° All other components within normal limits  °CBG MONITORING, ED - Abnormal; Notable for the following components:  ° Glucose-Capillary 111 (*)   ° All other components within normal limits  °POCT I-STAT EG7 - Abnormal; Notable for the following components:  ° pCO2, Ven 32.5 (*)   ° pO2, Ven 79.0 (*)   ° Bicarbonate 19.3 (*)   ° TCO2 20 (*)   ° Acid-base deficit 5.0 (*)   ° Calcium, Ion 1.10 (*)   ° All other components within normal limits  °POCT I-STAT 7, (LYTES, BLD GAS, ICA,H+H) - Abnormal; Notable for the following components:  ° pH, Arterial 7.149 (*)   ° pCO2 arterial 57.9 (*)   ° Acid-base deficit 9.0 (*)   ° All other components  within normal limits  °POCT I-STAT 7, (LYTES, BLD GAS, ICA,H+H) - Abnormal; Notable for the following components:  ° pO2, Arterial 223.0 (*)   ° All other components within normal limits  °TROPONIN I (HIGH SENSITIVITY) - Abnormal; Notable for the following components:  ° Troponin I (High Sensitivity) 49 (*)   ° All other components within normal limits  °SARS CORONAVIRUS 2 (HOSPITAL ORDER, PERFORMED IN [facility])  °CULTURE, RESPIRATORY  °MRSA PCR SCREENING  °LIPASE, BLOOD  °SALICYLATE LEVEL  °ETHANOL  °RAPID URINE DRUG SCREEN, HOSP PERFORMED  °MAGNESIUM  °PROCALCITONIN  °PROTIME-INR  °APTT  °TSH  °BLOOD GAS, ARTERIAL  °CBC  °MAGNESIUM  °PHOSPHORUS  °BLOOD GAS, ARTERIAL  °BLOOD GAS, ARTERIAL  °TROPONIN I (HIGH SENSITIVITY)  ° ° °EKG °EKG Interpretation ° °Date/Time:  Saturday June 18 2019 14:04:05 EDT °Ventricular Rate:  106 °PR Interval:    °QRS Duration: 123 °QT Interval:  362 °QTC Calculation: 481 °R Axis:   -52 °Text Interpretation:  Sinus tachycardia Left bundle branch block No previous ECGs available Confirmed by ,  ([id number]) on 06/18/2019 10:00:00 PM ° ° °Radiology °Ct Head Wo Contrast ° °Result Date: 06/18/2019 °CLINICAL DATA:  Altered mental status. EXAM: CT HEAD WITHOUT CONTRAST TECHNIQUE: Contiguous axial images were obtained from the base of the skull through the vertex without intravenous contrast. COMPARISON:  None. FINDINGS: Brain: Generalized parenchymal volume loss with commensurate dilatation of the ventricles and sulci. There is no mass, hemorrhage, edema or other evidence of acute parenchymal abnormality. No extra-axial hemorrhage. Vascular: No hyperdense vessel or unexpected calcification. Skull: No acute findings. Presumed stimulator apparatus overlying the frontal bone. Sinuses/Orbits: Small amount of fluid within the sphenoid sinus. Mild mucosal thickening within the ethmoid air cells. Remainder of the paranasal sinuses are clear. Other: None. IMPRESSION: No acute  intracranial abnormality. No intracranial mass, hemorrhage or edema. Electronically Signed   By: Stan  Maynard M.D.     On: 06/18/2019 15:17  ° °Dg Chest Portable 1 View ° °Result Date: 06/18/2019 °CLINICAL DATA:  Seizures.  Intubation and enteric tube placement. EXAM: PORTABLE CHEST 1 VIEW COMPARISON:  None. FINDINGS: Endotracheal tube tip 2.4 cm above the carina. Enteric tube entering the stomach with the tip below the field of view. The heart size and mediastinal contours are within normal limits. Normal pulmonary vascularity. Low lung volumes with bibasilar atelectasis. No focal consolidation, pleural effusion, or pneumothorax. No acute osseous abnormality. Left chest wall stimulator noted with leads coursing superiorly into the left neck. IMPRESSION: 1. Appropriately positioned endotracheal tube. 2. Low lung volumes with bibasilar atelectasis. Electronically Signed   By: William T Derry M.D.   On: 06/18/2019 15:10  ° °Dg Abd Portable 1v ° °Result Date: 06/18/2019 °CLINICAL DATA:  Seizures.  Enteric tube placement. EXAM: PORTABLE ABDOMEN - 1 VIEW COMPARISON:  None. FINDINGS: Enteric tube tip in the distal stomach. The bowel gas pattern is normal. No radio-opaque calculi or other significant radiographic abnormality are seen. Bibasilar atelectasis. No acute osseous abnormality. IMPRESSION: 1. Enteric tube tip in the distal stomach. Electronically Signed   By: William T Derry M.D.   On: 06/18/2019 15:11  ° ° °Procedures °.Critical Care °Performed by: , , MD °Authorized by: , , MD  ° °Critical care provider statement:  °  Critical care time (minutes):  45 °  Critical care was necessary to treat or prevent imminent or life-threatening deterioration of the following conditions:  CNS failure or compromise °  Critical care was time spent personally by me on the following activities:  Discussions with consultants, evaluation of patient's response to treatment, examination of patient, ordering and  performing treatments and interventions, ordering and review of laboratory studies, ordering and review of radiographic studies, pulse oximetry, re-evaluation of patient's condition, obtaining history from patient or surrogate and review of old charts ° °Date/Time: 06/18/2019 10:02 PM °Performed by: , , MD °Pre-anesthesia Checklist: Patient identified °Oxygen Delivery Method: Ambu bag °Induction Type: Rapid sequence °Ventilation: Nasal airway inserted- appropriate to patient size °Laryngoscope Size: Glidescope °Grade View: Grade I °Tube size: 7.5 mm °Number of attempts: 1 °Secured at: 23 cm °Tube secured with: ETT holder °Comments: glidescope with cric pressure to pass tube ° ° ° ° ° (including critical care time) ° °Medications Ordered in ED °Medications  °propofol (DIPRIVAN) 1000 MG/100ML infusion (70 mcg/kg/min × 80.2 kg Intravenous Rate/Dose Verify 06/18/19 1800)  °enoxaparin (LOVENOX) injection 40 mg (40 mg Subcutaneous Given 06/18/19 1755)  °famotidine (PEPCID) 40 MG/5ML suspension 20 mg (20 mg Per Tube Given 06/18/19 2102)  °fentaNYL (SUBLIMAZE) injection 25 mcg ( Intravenous Canceled Entry 06/18/19 1645)  °fentaNYL 2500mcg in NS 250mL (10mcg/ml) infusion-PREMIX (50 mcg/hr Intravenous Rate/Dose Verify 06/18/19 1800)  °fentaNYL (SUBLIMAZE) bolus via infusion 25 mcg (has no administration in time range)  °midazolam (VERSED) injection 1 mg (has no administration in time range)  °midazolam (VERSED) injection 1 mg (has no administration in time range)  °thiamine (B-1) injection 100 mg (has no administration in time range)  °multivitamin liquid 15 mL (15 mLs Oral Given 06/18/19 1838)  °folic acid injection 1 mg (1 mg Intravenous Given 06/18/19 1833)  °Ampicillin-Sulbactam (UNASYN) 3 g in sodium chloride 0.9 % 100 mL IVPB (3 g Intravenous New Bag/Given 06/18/19 1833)  °brimonidine (ALPHAGAN) 0.2 % ophthalmic solution 1 drop (1 drop Both Eyes Given 06/18/19 2104)  °levothyroxine (SYNTHROID) tablet 37.5 mcg (has  no administration in time range)  °montelukast (SINGULAIR) tablet 10 mg (10 mg Oral   Given 06/18/19 2102)  °timolol (TIMOPTIC) 0.5 % ophthalmic solution 1 drop (1 drop Both Eyes Given 06/18/19 2103)  °atorvastatin (LIPITOR) tablet 20 mg (20 mg Per Tube Given 06/18/19 2102)  °levETIRAcetam (KEPPRA) IVPB 500 mg/100 mL premix (has no administration in time range)  °chlorhexidine gluconate (MEDLINE KIT) (PERIDEX) 0.12 % solution 15 mL (15 mLs Mouth Rinse Given 06/18/19 1940)  °MEDLINE mouth rinse (15 mLs Mouth Rinse Given 06/18/19 2108)  °ondansetron (ZOFRAN) injection 4 mg (4 mg Intravenous Given 06/18/19 1424)  °sodium chloride 0.9 % bolus 1,000 mL (0 mLs Intravenous Stopped 06/18/19 1530)  °LORazepam (ATIVAN) 2 MG/ML injection (  Given 06/18/19 1432)  °etomidate (AMIDATE) injection (20 mg Intravenous Given 06/18/19 1441)  °rocuronium (ZEMURON) injection (90 mg Intravenous Given 06/18/19 1441)  °levETIRAcetam (KEPPRA) 2,000 mg in sodium chloride 0.9 % 100 mL IVPB ( Intravenous Rate/Dose Verify 06/18/19 1800)  ° ° ° °Initial Impression / Assessment and Plan / ED Course  °I have reviewed the triage vital signs and the nursing notes. ° °Pertinent labs & imaging results that were available during my care of the patient were reviewed by me and considered in my medical decision making (see chart for details). ° °  ° °  ° °73yo male with history of depression presents from [facility] who presents with concern for episode of syncope with possible seizure-like activity.  On arrival to the ED, he is altered without focal deficits.  Planned for emergent CT, tox labs, and called Neurology Dr. Arora emergently given acute change in mental status although no clear focal deficits and discussion of possible Code Stroke.  Immediately after my discussion with Dr. Arora and immediately before transport to CT, I walked into the room and patient began having right sided gaze deviation and right arm shaking. Initially he was able to  answer me when I walked into the room and was awake during this episode and called Dr. Arora regarding focal seizure, however soon after this the patient became unresponsive with generalized tonic clonic activity and was given IV ativan.  Total episode lasted approximately 1 minute. Given concern for EMS report of headache, pt with emesis and now focal to generalized seizure with concern for need for emergent head CT, pt not able to protect airway, he was intubated for airway protection and transported emergently for CT.  He was given ativan and keppra. ° °CT head without acute abnormalities.  Patient hooked up to LTM. No significant metabolic abnormalities on labs, no sign of ingestions.  Consider possible etoh withdrawal versus other underlying seizure disorder.  Patient admitted to ICU with Neurology following.  ° °Final Clinical Impressions(s) / ED Diagnoses  ° °Final diagnoses:  °Altered mental status, unspecified altered mental status type  °Seizures (HCC)  ° ° °ED Discharge Orders   ° None  °  ° °  ° °  °, , MD °06/18/19 2203 ° °

## 2019-06-18 NOTE — ED Notes (Signed)
Pt seizing

## 2019-06-18 NOTE — Progress Notes (Signed)
LTM started Dr Rory Percy notified

## 2019-06-18 NOTE — Consult Note (Signed)
Neurology Consultation  Reason for Consult: Sudden onset of loss of consciousness and fall, seizure Referring Physician: Dr. Gareth Morgan  CC: Sudden loss of consciousness and fall and seizure  History is obtained from: Chart review-limited history in the chart, call with the brother who also could only provide limited history.  HPI: Robert Cummings. is a 73 y.o. male past medical history of depression and questionable history of alcohol dependence and one seizure many years ago along with a history of concussion 15 years ago according to the brother, residing in an independent living facility at [facility], was brought in when staff noted that patient became gray after a sudden fall.  Has right arm stiffening, he went to the ground and had a possible seizure.  He was brought into the emergency room, evaluated.  I was called for an emergent consultation and by the time I reached the patient's bedside a couple minutes after being called, the patient had already had an episode of vomiting, a focal seizure which was described as starting from his right arm ascending up and then involving the whole body. He was having difficulty protecting his airway and was being bagged.There is been no report of loss of pulse.  He was ultimately intubated.  He was taken in for a noncontrast CT of the head which was essentially unremarkable for any acute process.  At baseline, the patient lives in independent living but has multiple memory issues more so with short-term memory and executive function.  His brother said that the patient is exceptionally vulnerable and gets taken advantage of.  He said he had a concussion 15 years ago.  There was probably a seizure around that time as well.  When I asked if the patient seizure was associated with alcohol or drugs he said it probably would have been alcohol indicating towards a possible alcohol abuse history.  He is not able to confirm as the patient currently abuses  alcohol.  LKW: Unclear tpa given?: no, not stroke-likely seizure Premorbid modified Rankin scale (mRS):2  ROS: Unable to obtain due to altered mental status Past Medical History:  Diagnosis Date   Depression     No family history on file. Patient unable to provide due to altered mental status  Social History:   has no history on file for tobacco, alcohol, and drug. Patient unable to provide due to altered mental status  Medications  Current Facility-Administered Medications:    etomidate (AMIDATE) injection, , Intravenous, PRN, Gareth Morgan, MD, 20 mg at 06/18/19 1441   propofol (DIPRIVAN) 1000 MG/100ML infusion, 5-80 mcg/kg/min, Intravenous, Continuous, Schlossman, Erin, MD, Last Rate: 4.81 mL/hr at 06/18/19 1449, 10 mcg/kg/min at 06/18/19 1449   rocuronium (ZEMURON) injection, , Intravenous, PRN, Gareth Morgan, MD, 90 mg at 06/18/19 1441 No current outpatient medications on file.  Based on my phone call with the facility, which then directed me to living well at home-[phone]-the medication list as below: Trazodone Valtrex Acetaminophen Prednisone 20 mg Zoloft Singulair Zyrtec Nasal spray  Exam: Current vital signs: BP (!) 157/99    Pulse (!) 129    Temp (!) 97.5 F (36.4 C) (Temporal)    Resp 16    Ht 5\' 8"  (1.727 m)    Wt 80.2 kg    SpO2 96%    BMI 26.90 kg/m  Vital signs in last 24 hours: Temp:  [97.5 F (36.4 C)] 97.5 F (36.4 C) (08/15 1404) Pulse Rate:  [73-150] 129 (08/15 1525) Resp:  [16-38] 16 (  08/15 1525) BP: (150-203)/(95-129) 157/99 (08/15 1525) SpO2:  [76 %-96 %] 96 % (08/15 1525) FiO2 (%):  [100 %] 100 % (08/15 1557) Weight:  [80.2 kg] 80.2 kg (08/15 1408)  General: Not on any sedation   Labs I have reviewed labs in epic and the results pertinent to this consultation are:  CBC    Component Value Date/Time   WBC 7.9 06/18/2019 1425   RBC 4.62 06/18/2019 1425   HGB 14.6 06/18/2019 1544   HCT 43.0 06/18/2019 1544   PLT 196  06/18/2019 1425   MCV 97.8 06/18/2019 1425   MCH 33.3 06/18/2019 1425   MCHC 34.1 06/18/2019 1425   RDW 12.1 06/18/2019 1425   LYMPHSABS 2.8 06/18/2019 1425   MONOABS 0.8 06/18/2019 1425   EOSABS 0.1 06/18/2019 1425   BASOSABS 0.1 06/18/2019 1425    CMP     Component Value Date/Time   NA 140 06/18/2019 1544   K 4.1 06/18/2019 1544   CL 103 06/18/2019 1425   CO2 18 (L) 06/18/2019 1425   GLUCOSE 131 (H) 06/18/2019 1425   BUN 14 06/18/2019 1425   CREATININE 1.18 06/18/2019 1425   CALCIUM 9.0 06/18/2019 1425   PROT 6.8 06/18/2019 1425   ALBUMIN 4.2 06/18/2019 1425   AST 44 (H) 06/18/2019 1425   ALT 33 06/18/2019 1425   ALKPHOS 74 06/18/2019 1425   BILITOT 0.9 06/18/2019 1425   GFRNONAA >60 06/18/2019 1425   GFRAA >60 06/18/2019 1425    Imaging I have reviewed the images obtained:  CT-scan of the brain-no acute changes Has some sort of a stimulator which the family does not have details of     Assessment: 73 year old past history of questionable alcohol dependence, depression, one seizure in the past, no history of any antiepileptic medications, brought in with a possible seizure and then had another seizure in the emergency room requiring emergent intubation. The seizures reported to be having started from his right arm and then generalized. Questionable history of alcohol abuse  Impression Focal seizure with secondary generalization?  New onset versus alcohol withdrawal Status epilepticus  Recommendations: IV Ativan Start Keppra 2 g IV. Stat EEG Supportive management per ICU Attempt to obtain more history as well as more information about the stimulator and on his head. We will continue to follow with you.  -- Amie Portland, MD [facility] Pager: [phone] If 7pm to 7am, please call on call as listed on AMION.  CRITICAL CARE ATTESTATION Performed by: Amie Portland, MD Total critical care time: 40 minutes Critical care time was exclusive of  separately billable procedures and treating other patients and/or supervising APPs/Residents/Students Critical care was necessary to treat or prevent imminent or life-threatening deterioration due to seizure, status epilepticus  This patient is critically ill and at significant risk for neurological worsening and/or death and care requires constant monitoring. Critical care was time spent personally by me on the following activities: development of treatment plan with patient and/or surrogate as well as nursing, discussions with consultants, evaluation of patient's response to treatment, examination of patient, obtaining history from patient or surrogate, ordering and performing treatments and interventions, ordering and review of laboratory studies, ordering and review of radiographic studies, pulse oximetry, re-evaluation of patient's condition, participation in multidisciplinary rounds and medical decision making of high complexity in the care of this patient.  Addendum Patient got his EEG done.  Preliminary review does not show status epilepticus.  Suggestive of severe encephalopathy. Chest x-ray shows the generator for the stimulator  seen on head CT.  Still unclear what this device is. Chest x-ray also reveals moderate to severe aspiration pneumonitis.    Updated recommendations: Will hook up to LTM EEG because unclear why he seized. Continue Keppra 500 twice daily Management of aspiration pneumonitis per primary team as you are.  We will continue to follow  -- Amie Portland, MD [facility] Pager: [phone] If 7pm to 7am, please call on call as listed on AMION.

## 2019-06-18 NOTE — Progress Notes (Signed)
RT note: RT and RN transported patient on Ventilator to CT and back to ED. Vital signs stable through out.

## 2019-06-18 NOTE — Progress Notes (Signed)
Pt admitted with belongings at bedside (pants, shirt, belt, shoes, socks, glasses).

## 2019-06-18 NOTE — Progress Notes (Signed)
RT note: RT and RN transported patient from ED to [place]. Vital signs stable through out.

## 2019-06-19 ENCOUNTER — Inpatient Hospital Stay (HOSPITAL_COMMUNITY): Payer: Medicare Other

## 2019-06-19 LAB — GLUCOSE, CAPILLARY
Glucose-Capillary: 109 mg/dL — ABNORMAL HIGH (ref 70–99)
Glucose-Capillary: 127 mg/dL — ABNORMAL HIGH (ref 70–99)
Glucose-Capillary: 92 mg/dL (ref 70–99)
Glucose-Capillary: 93 mg/dL (ref 70–99)

## 2019-06-19 LAB — CBC
HCT: 38.9 % — ABNORMAL LOW (ref 39.0–52.0)
Hemoglobin: 13.2 g/dL (ref 13.0–17.0)
MCH: 32.8 pg (ref 26.0–34.0)
MCHC: 33.9 g/dL (ref 30.0–36.0)
MCV: 96.8 fL (ref 80.0–100.0)
Platelets: 129 10*3/uL — ABNORMAL LOW (ref 150–400)
RBC: 4.02 MIL/uL — ABNORMAL LOW (ref 4.22–5.81)
RDW: 12.5 % (ref 11.5–15.5)
WBC: 7.4 10*3/uL (ref 4.0–10.5)
nRBC: 0 % (ref 0.0–0.2)

## 2019-06-19 LAB — POCT I-STAT 7, (LYTES, BLD GAS, ICA,H+H)
Acid-base deficit: 3 mmol/L — ABNORMAL HIGH (ref 0.0–2.0)
Bicarbonate: 20.5 mmol/L (ref 20.0–28.0)
Calcium, Ion: 1.15 mmol/L (ref 1.15–1.40)
HCT: 38 % — ABNORMAL LOW (ref 39.0–52.0)
Hemoglobin: 12.9 g/dL — ABNORMAL LOW (ref 13.0–17.0)
O2 Saturation: 99 %
Patient temperature: 99.8
Potassium: 3.4 mmol/L — ABNORMAL LOW (ref 3.5–5.1)
Sodium: 141 mmol/L (ref 135–145)
TCO2: 21 mmol/L — ABNORMAL LOW (ref 22–32)
pCO2 arterial: 31.3 mmHg — ABNORMAL LOW (ref 32.0–48.0)
pH, Arterial: 7.426 (ref 7.350–7.450)
pO2, Arterial: 119 mmHg — ABNORMAL HIGH (ref 83.0–108.0)

## 2019-06-19 LAB — PHOSPHORUS: Phosphorus: 3 mg/dL (ref 2.5–4.6)

## 2019-06-19 LAB — MAGNESIUM: Magnesium: 2.3 mg/dL (ref 1.7–2.4)

## 2019-06-19 MED ORDER — MONTELUKAST SODIUM 10 MG PO TABS
10.0000 mg | ORAL_TABLET | Freq: Every day | ORAL | Status: DC
  Administered 2019-06-19 – 2019-06-21 (×2): 10 mg
  Filled 2019-06-19 (×2): qty 1

## 2019-06-19 NOTE — Progress Notes (Signed)
Discontinued vLTM   No skin breakdown noted at d/c

## 2019-06-19 NOTE — Progress Notes (Signed)
Neurology Progress Note  S:// Patient seen and examined. No seizure activity overnight Intermittently follows commands off sedation.  O:// Current vital signs: BP 109/65 (BP Location: Left Arm)   Pulse 73   Temp 98.2 F (36.8 C) (Axillary)   Resp (!) 24   Ht _0  (1.727 m)   Wt 97.9 kg   SpO2 96%   BMI 32.82 kg/m  Vital signs in last 24 hours: Temp:  [97.5 F (36.4 C)-98.8 F (37.1 C)] 98.2 F (36.8 C) (08/16 0351) Pulse Rate:  [73-150] 73 (08/16 0800) Resp:  [16-38] 24 (08/16 0800) BP: (100-203)/(65-129) 109/65 (08/16 0800) SpO2:  [76 %-100 %] 96 % (08/16 0800) FiO2 (%):  [50 %-100 %] 50 % (08/16 0720) Weight:  [80.2 kg-97.9 kg] 97.9 kg (08/16 0405) General: Intubated, on propofol for sedation. Sedation held for the examination HEENT: Normocephalic atraumatic head wrapped in bandage with EEG leads. CVS: Regular rate rhythm Respiratory: Vented Abdomen: Nondistended nontender Extremities: Warm well perfused Neurological exam Patient was on sedation which was held for exam. He remains intubated. He was able to follow all commands in all 4 extremities and nod appropriately yes and no to all questions. Cranial nerves: Pupils are equal round react to light, extraocular movements are intact. Face symmetric. Motor: moving all 4 to commands and is antigravity Sensory: intact to LT Coord: difficult to assess due to mentation/intubation Gait deferred  Medications  Current Facility-Administered Medications:  .  Ampicillin-Sulbactam (UNASYN) 3 g in sodium chloride 0.9 % 100 mL IVPB, 3 g, Intravenous, Q6H, Romona Curls, RPH, Stopped at 06/19/19 8264 .  atorvastatin (LIPITOR) tablet 20 mg, 20 mg, Per Tube, QHS, Chesley Mires, MD, 20 mg at 06/18/19 2102 .  brimonidine (ALPHAGAN) 0.2 % ophthalmic solution 1 drop, 1 drop, Both Eyes, BID, Chesley Mires, MD, 1 drop at 06/18/19 2104 .  chlorhexidine gluconate (MEDLINE KIT) (PERIDEX) 0.12 % solution 15 mL, 15 mL, Mouth Rinse, BID,  Halford Chessman, Vineet, MD, 15 mL at 06/19/19 0747 .  enoxaparin (LOVENOX) injection 40 mg, 40 mg, Subcutaneous, Q24H, Icard, Bradley L, DO, 40 mg at 06/18/19 1755 .  famotidine (PEPCID) 40 MG/5ML suspension 20 mg, 20 mg, Per Tube, BID, Icard, Bradley L, DO, 20 mg at 06/18/19 2102 .  fentaNYL (SUBLIMAZE) bolus via infusion 25 mcg, 25 mcg, Intravenous, Q15 min PRN, Icard, Bradley L, DO .  fentaNYL (SUBLIMAZE) injection 25 mcg, 25 mcg, Intravenous, Once, Icard, Bradley L, DO .  fentaNYL 2562mg in NS 2519m(1030mml) infusion-PREMIX, 25-200 mcg/hr, Intravenous, Continuous, Icard, Bradley L, DO, Last Rate: 7.5 mL/hr at 06/19/19 0800, 75 mcg/hr at 06/19/19 0800 .  folic acid injection 1 mg, 1 mg, Intravenous, Daily, Icard, Bradley L, DO, 1 mg at 06/18/19 1833 .  levETIRAcetam (KEPPRA) IVPB 500 mg/100 mL premix, 500 mg, Intravenous, Q12H, AroAmie PortlandD, Stopped at 06/19/19 0413 .  levothyroxine (SYNTHROID) tablet 37.5 mcg, 37.5 mcg, Oral, QAC breakfast, SooChesley MiresD, 37.5 mcg at 06/19/19 0530 .  MEDLINE mouth rinse, 15 mL, Mouth Rinse, 10 times per day, SooChesley MiresD, 15 mL at 06/19/19 0631 .  midazolam (VERSED) injection 1 mg, 1 mg, Intravenous, Q15 min PRN, Icard, Bradley L, DO .  midazolam (VERSED) injection 1 mg, 1 mg, Intravenous, Q2H PRN, Icard, Bradley L, DO .  montelukast (SINGULAIR) tablet 10 mg, 10 mg, Oral, QHS, Sood, Vineet, MD, 10 mg at 06/18/19 2102 .  multivitamin liquid 15 mL, 15 mL, Oral, Daily, Icard, Bradley L, DO, 15 mL at 06/18/19 1838 .  propofol (DIPRIVAN) 1000 MG/100ML infusion, 5-80 mcg/kg/min, Intravenous, Continuous, Schlossman, Erin, MD, Last Rate: 28.8 mL/hr at 06/19/19 0800, 60 mcg/kg/min at 06/19/19 0800 .  thiamine (B-1) injection 100 mg, 100 mg, Intravenous, Q24H, Icard, Bradley L, DO .  timolol (TIMOPTIC) 0.5 % ophthalmic solution 1 drop, 1 drop, Both Eyes, BID, Chesley Mires, MD, 1 drop at 06/18/19 2103 Labs CBC    Component Value Date/Time   WBC 7.4 06/19/2019 0628    RBC 4.02 (L) 06/19/2019 0628   HGB 13.2 06/19/2019 0628   HCT 38.9 (L) 06/19/2019 0628   PLT 129 (L) 06/19/2019 0628   MCV 96.8 06/19/2019 0628   MCH 32.8 06/19/2019 0628   MCHC 33.9 06/19/2019 0628   RDW 12.5 06/19/2019 0628   LYMPHSABS 2.8 06/18/2019 1425   MONOABS 0.8 06/18/2019 1425   EOSABS 0.1 06/18/2019 1425   BASOSABS 0.1 06/18/2019 1425    CMP     Component Value Date/Time   NA 141 06/19/2019 0307   K 3.4 (L) 06/19/2019 0307   CL 103 06/18/2019 1425   CO2 18 (L) 06/18/2019 1425   GLUCOSE 131 (H) 06/18/2019 1425   BUN 14 06/18/2019 1425   CREATININE 1.18 06/18/2019 1425   CALCIUM 9.0 06/18/2019 1425   PROT 6.8 06/18/2019 1425   ALBUMIN 4.2 06/18/2019 1425   AST 44 (H) 06/18/2019 1425   ALT 33 06/18/2019 1425   ALKPHOS 74 06/18/2019 1425   BILITOT 0.9 06/18/2019 1425   GFRNONAA >60 06/18/2019 1425   GFRAA >60 06/18/2019 1425   Imaging I have reviewed images in epic and the results pertinent to this consultation are: CT-scan of the brain-subcutaneous stimulator in place, no acute bleed. CXR with bilateral right worse than left densities likely aspiration pneumonia  Assessment: 72 year old man, history of questionable alcohol dependence, depression, presumably one seizure in the past and no history of being on antiepileptics or continuing seizure activity brought in with a possible seizure episode and then had another seizure in the emergency room with vomiting requiring intubation for airway protection. No seizure activity after receiving Ativan and being loaded with Keppra. Overnight has been on LTM-formal read pending but no gross clinical seizure activity noted. Examination reassuring-following all commands. Intubated mainly for airway protection and aspiration pneumonia   Impression: Status epilepticus-resolved New onset seizure Aspiration pneumonia  Recommendations: Once the formal read of again is available, most likely discontinue LTM. We will  update the team after the formal read. Continue Keppra 500 twice daily IV Ativan for breakthrough seizure Seizure precautions Treatment of aspiration pneumonia per primary team as you are We will continue to follow with you. Discussed my plan with Dr. Doyne Keel on the unit    -- Amie Portland, MD [facility] Pager: [phone] If 7pm to 7am, please call on call as listed on AMION.   CRITICAL CARE ATTESTATION Performed by: Amie Portland, MD Total critical care time: 35 minutes Critical care time was exclusive of separately billable procedures and treating other patients and/or supervising APPs/Residents/Students Critical care was necessary to treat or prevent imminent or life-threatening deterioration due to status epilepticus, new onset seizure activity. This patient is critically ill and at significant risk for neurological worsening and/or death and care requires constant monitoring. Critical care was time spent personally by me on the following activities: development of treatment plan with patient and/or surrogate as well as nursing, discussions with consultants, evaluation of patient's response to treatment, examination of patient, obtaining history from patient or surrogate, ordering and performing treatments  and interventions, ordering and review of laboratory studies, ordering and review of radiographic studies, pulse oximetry, re-evaluation of patient's condition, participation in multidisciplinary rounds and medical decision making of high complexity in the care of this patient.

## 2019-06-19 NOTE — H&P (Addendum)
NAME:  Robert, Cummings MRN:  [id number], DOB:  September 18, 1946, LOS: 1 ADMISSION DATE:  06/18/2019, CONSULTATION DATE:  06/18/2019 REFERRING MD:  Dr. Billy Fischer, ER, CHIEF COMPLAINT: seizure  Brief History   73 yo male from [place] with seizure.  Required intubation for airway protection in ER.  Had c/o headache recently.  Hx from chart and ER staff.  Pt unable to provide Hx.  Past Medical History  HLD, Allergies, Hypothyroidism, Depression  Significant Hospital Events   8/15 Admit  Consults:  Neurology 8/15 seizure  Procedures:  ETT 8/15 >>   Significant Diagnostic Tests:  CT head 8/15 >> no acute findings CXR 8/16 >> lines in place, no airspace disease. Micro Data:  COVID 8/15 >> negative Sputum 8/15 >>   Antimicrobials:  Unasyn 8/15 >>   Interim history/subjective:  No further seizures. Unable to wean due to apnea from sedation.   Objective   Blood pressure 105/66, pulse 72, temperature 98.2 F (36.8 C), temperature source Axillary, resp. rate (!) 24, height 5\' 8"  (1.727 m), weight 97.9 kg, SpO2 96 %.    Vent Mode: PRVC FiO2 (%):  [50 %-100 %] 50 % Set Rate:  [16 bmp-24 bmp] 24 bmp Vt Set:  [540 mL] 540 mL PEEP:  [8 cmH20-10 cmH20] 8 cmH20 Plateau Pressure:  [18 cmH20-120 cmH20] 18 cmH20   Intake/Output Summary (Last 24 hours) at 06/19/2019 1017 Last data filed at 06/19/2019 0900 Gross per 24 hour  Intake 1916.37 ml  Output 950 ml  Net 966.37 ml   Filed Weights   06/18/19 1408 06/19/19 0405  Weight: 80.2 kg 97.9 kg    Examination:  General - sedated Eyes - pupils reactive, roving eye movements. No icterus. ENT - ETT in place Cardiac - regular, tachycardic, no murmur Chest - clear bilaterally.  Abdomen - soft, non tender, + bowel sounds Extremities - no cyanosis, clubbing, or edema Skin - no rashes Neuro - Localizes briskly to pain but no response to verbal stimulation.  Personally reviewed cEEG: diffuse slowing compatible  with encephalopathy. No epileptiform discharges.  Resolved Hospital Problem list   Status epilepticus.  Assessment & Plan:   Critically ill due to acute hypoxic, hypercapnic respiratory failure with compromised airway, requiring mechanical ventilation. - SBT today.  Acute metabolic encephalopathy. Seizure with reported hx of ETOH. - neurology consulted - f/u EEG - continue keppra - Wean sedation - thiamine, folic acid  Aspiration pneumonitis. - f/u sputum culture - start unasyn  Hx of HLD. - continue lipitor  Hx of hypothyroidism. - continue synthroid - check TSH  Hx of depression. - hold outpt zoloft  Hx of allergies. - continue singulair - hold zyrtec  Best practice:  Diet: NPO DVT prophylaxis: lovenox GI prophylaxis: pepcid Mobility: bed rest Code Status: full code Disposition: ICU  Labs   CBC: Recent Labs  Lab 06/18/19 1425  06/18/19 1544 06/18/19 1741 06/18/19 1935 06/19/19 0307 06/19/19 0628  WBC 7.9  --   --  11.1*  --   --  7.4  NEUTROABS 4.0  --   --   --   --   --   --   HGB 15.4   < > 14.6 14.5 13.3 12.9* 13.2  HCT 45.2   < > 43.0 41.4 39.0 38.0* 38.9*  MCV 97.8  --   --  95.8  --   --  96.8  PLT 196  --   --  168  --   --  129*   < > = values in this interval not displayed.    Basic Metabolic Panel: Recent Labs  Lab 06/18/19 1425 06/18/19 1435 06/18/19 1544 06/18/19 1741 06/18/19 1935 06/19/19 0307 06/19/19 0628  NA 137 139 140  --  140 141  --   K 4.1 4.1 4.1  --  3.6 3.4*  --   CL 103  --   --   --   --   --   --   CO2 18*  --   --   --   --   --   --   GLUCOSE 131*  --   --   --   --   --   --   BUN 14  --   --   --   --   --   --   CREATININE 1.18  --   --   --   --   --   --   CALCIUM 9.0  --   --   --   --   --   --   MG  --   --   --  2.3  --   --  2.3  PHOS  --   --   --  2.2*  --   --  3.0   GFR: Estimated Creatinine Clearance: 65.1 mL/min (by C-G formula based on SCr of 1.18 mg/dL). Recent Labs  Lab 06/18/19  1425 06/18/19 1741 06/19/19 0628  PROCALCITON  --  <0.10  --   WBC 7.9 11.1* 7.4  LATICACIDVEN  --  2.6*  --     Liver Function Tests: Recent Labs  Lab 06/18/19 1425  AST 44*  ALT 33  ALKPHOS 74  BILITOT 0.9  PROT 6.8  ALBUMIN 4.2   Recent Labs  Lab 06/18/19 1425  LIPASE 40   No results for input(s): AMMONIA in the last 168 hours.  ABG    Component Value Date/Time   PHART 7.426 06/19/2019 0307   PCO2ART 31.3 (L) 06/19/2019 0307   PO2ART 119.0 (H) 06/19/2019 0307   HCO3 20.5 06/19/2019 0307   TCO2 21 (L) 06/19/2019 0307   ACIDBASEDEF 3.0 (H) 06/19/2019 0307   O2SAT 99.0 06/19/2019 0307     Coagulation Profile: Recent Labs  Lab 06/18/19 1741  INR 1.0    Cardiac Enzymes: No results for input(s): CKTOTAL, CKMB, CKMBINDEX, TROPONINI in the last 168 hours.  HbA1C: No results found for: HGBA1C  CBG: Recent Labs  Lab 06/18/19 1408 06/18/19 2006 06/19/19 0015 06/19/19 0353  GLUCAP 111* 123* 127* 109*    CRITICAL CARE Performed by: Kipp Brood   Total critical care time: 35 minutes  Critical care time was exclusive of separately billable procedures and treating other patients.  Critical care was necessary to treat or prevent imminent or life-threatening deterioration.  Critical care was time spent personally by me on the following activities: development of treatment plan with patient and/or surrogate as well as nursing, discussions with consultants, evaluation of patient's response to treatment, examination of patient, obtaining history from patient or surrogate, ordering and performing treatments and interventions, ordering and review of laboratory studies, ordering and review of radiographic studies, pulse oximetry, re-evaluation of patient's condition and participation in multidisciplinary rounds.  Kipp Brood, MD [facility] ICU Physician [place]  Pager: [phone] Mobile: [phone] After hours: [phone].      .06/19/2019, 10:17 AM

## 2019-06-19 NOTE — Procedures (Signed)
Patient Name: Robert Cummings.  MRN: [id number]  Epilepsy Attending: Lora Havens  Referring Physician/Provider: Dr Amie Portland Duration: 06/18/2019 1651  Patient history: 73yo M with seizure like episode. EEG to evaluate for seizure.  Level of alertness: sedated  Technical aspects: This EEG study was done with scalp electrodes positioned according to the 10-20 International system of electrode placement. Electrical activity was acquired at a sampling rate of 500Hz  and reviewed with a high frequency filter of 70Hz  and a low frequency filter of 1Hz . EEG data were recorded continuously and digitally stored.   DESCRIPTION: EEG showed continuous generalized 4-6hz  theta slowing intermixed with an excessive amount of 15 to 18 Hz, 2-3 uV beta activity with irregular morphology distributed symmetrically and diffusely.  Hyperventilation and photic stimulation were not performed.  IMPRESSION: This study is suggestive of mild diffuse encephalopathy. No seizures or epileptiform discharges were seen throughout the recording.  The excessive beta activity seen in the background could be due to the effect of benzodiazepine.    Sergio Zawislak Barbra Sarks

## 2019-06-20 LAB — CBC
HCT: 39 % (ref 39.0–52.0)
Hemoglobin: 13.4 g/dL (ref 13.0–17.0)
MCH: 33.9 pg (ref 26.0–34.0)
MCHC: 34.4 g/dL (ref 30.0–36.0)
MCV: 98.7 fL (ref 80.0–100.0)
Platelets: 128 10*3/uL — ABNORMAL LOW (ref 150–400)
RBC: 3.95 MIL/uL — ABNORMAL LOW (ref 4.22–5.81)
RDW: 12.7 % (ref 11.5–15.5)
WBC: 5.8 10*3/uL (ref 4.0–10.5)
nRBC: 0 % (ref 0.0–0.2)

## 2019-06-20 LAB — GLUCOSE, CAPILLARY
Glucose-Capillary: 106 mg/dL — ABNORMAL HIGH (ref 70–99)
Glucose-Capillary: 111 mg/dL — ABNORMAL HIGH (ref 70–99)
Glucose-Capillary: 116 mg/dL — ABNORMAL HIGH (ref 70–99)
Glucose-Capillary: 120 mg/dL — ABNORMAL HIGH (ref 70–99)
Glucose-Capillary: 124 mg/dL — ABNORMAL HIGH (ref 70–99)
Glucose-Capillary: 92 mg/dL (ref 70–99)

## 2019-06-20 LAB — PHOSPHORUS: Phosphorus: 3.9 mg/dL (ref 2.5–4.6)

## 2019-06-20 LAB — BASIC METABOLIC PANEL
Anion gap: 11 (ref 5–15)
BUN: 16 mg/dL (ref 8–23)
CO2: 19 mmol/L — ABNORMAL LOW (ref 22–32)
Calcium: 8 mg/dL — ABNORMAL LOW (ref 8.9–10.3)
Chloride: 109 mmol/L (ref 98–111)
Creatinine, Ser: 1.27 mg/dL — ABNORMAL HIGH (ref 0.61–1.24)
GFR calc Af Amer: >60 mL/min (ref >60)
GFR calc non Af Amer: 56 mL/min — ABNORMAL LOW (ref >60)
Glucose, Bld: 100 mg/dL — ABNORMAL HIGH (ref 70–99)
Potassium: 3.5 mmol/L (ref 3.5–5.1)
Sodium: 139 mmol/L (ref 135–145)

## 2019-06-20 LAB — TRIGLYCERIDES: Triglycerides: 885 mg/dL — ABNORMAL HIGH (ref <150)

## 2019-06-20 LAB — MRSA PCR SCREENING

## 2019-06-20 LAB — MAGNESIUM: Magnesium: 2.5 mg/dL — ABNORMAL HIGH (ref 1.7–2.4)

## 2019-06-20 MED ORDER — DEXMEDETOMIDINE HCL IN NACL 200 MCG/50ML IV SOLN
0.4000 ug/kg/h | INTRAVENOUS | Status: DC
  Administered 2019-06-20 (×2): 0.8 ug/kg/h via INTRAVENOUS
  Administered 2019-06-20: 0.5 ug/kg/h via INTRAVENOUS
  Administered 2019-06-20: 0.4 ug/kg/h via INTRAVENOUS
  Administered 2019-06-20 – 2019-06-21 (×5): 0.8 ug/kg/h via INTRAVENOUS
  Filled 2019-06-20: qty 100
  Filled 2019-06-20 (×2): qty 50
  Filled 2019-06-20: qty 100
  Filled 2019-06-20 (×4): qty 50

## 2019-06-20 MED ORDER — ACETAMINOPHEN 325 MG PO TABS: 650.0000 mg | ORAL_TABLET | Freq: Four times a day (QID) | ORAL | Status: DC | PRN

## 2019-06-20 NOTE — Progress Notes (Signed)
Patient self extubated despite sedation and bilateral wrist restraints. He is doing well on 4L nasal cannula. MD notified. Thayer Ohm D

## 2019-06-20 NOTE — Progress Notes (Signed)
Called by RN at 12:52 Patient had self-extubated. Patient was on NRB with SPO2 100%. Patient had strong cough, BBS clear; diminished. No stridor noted. Patient able to speak clearly. Patient weaned down to 5 LPM nasal cannula. SPO2 95%. No distress noted. RT will continue to monitor.

## 2019-06-20 NOTE — Progress Notes (Signed)
[facility] Progress Note Patient Name: Robert Cummings. DOB: 1946-11-02 MRN: [id number]   Date of Service  06/20/2019  HPI/Events of Note  Triglyceride in 800 range. Is on 75 mic/hour fentanyl and propofol 60. Rn notes patient can be very agitated even while sedated and does not feel he is ready to be weaned off.  eICU Interventions  Will use precedex, and then turn off propofol Continue fentanyl Asked RN to first begin the precedex and then turn off propofol when he is calm and better sedated, in order to prevent self extubation This is the reason why I have not discontinued the propofol order at this time     Intervention Category Major Interventions: Delirium, psychosis, severe agitation - evaluation and management  Margaretmary Lombard 06/20/2019, 5:46 AM

## 2019-06-20 NOTE — Progress Notes (Signed)
NAME:  Robert Cummings, Robert Cummings MRN:  [id number], DOB:  06-22-1946, LOS: 2 ADMISSION DATE:  06/18/2019, CONSULTATION DATE:  06/18/2019 REFERRING MD:  Dr. Billy Fischer, ER, CHIEF COMPLAINT: seizure  Brief History   73 yo male with PMHx of depression, possible alcohol use disorder, and remote history of concussion who presented from [place] with seizure. Required intubation for airway protection in ED. Had c/o headache recently.  Hx from chart and ER staff.  Pt unable to provide Hx.  Past Medical History  HLD, Allergies, Hypothyroidism, Depression  Significant Hospital Events   8/15 Admit  Consults:  Neurology 8/15 seizure  Procedures:  ETT 8/15 >>   Significant Diagnostic Tests:  CT head 8/15 >> no acute findings CXR 8/16 >> lines in place, no airspace disease. Micro Data:  COVID 8/15 >> negative Sputum 8/15 >>   Antimicrobials:  Unasyn 8/15 >>   Interim history/subjective:  In the interim, cEEG was discontinued after no seizure or epileptiform activity was observed; consistent with mild diffuse encephalopathy.  Overnight, he did have some agitation and was started on Precedex with plans to wean propofol (triglycerides noted to be in 800s).  Patient had a Tmax of 37.8 this AM (8/17).  Objective   Blood pressure 119/72, pulse 64, temperature 100.1 F (37.8 C), temperature source Axillary, resp. rate (!) 24, height 5\' 8"  (1.727 m), weight 97.9 kg, SpO2 94 %.    Vent Mode: PRVC FiO2 (%):  [40 %] 40 % Set Rate:  [24 bmp] 24 bmp Vt Set:  [540 mL] 540 mL PEEP:  [5 cmH20] 5 cmH20 Pressure Support:  [8 cmH20] 8 cmH20 Plateau Pressure:  [16 cmH20-18 cmH20] 18 cmH20   Intake/Output Summary (Last 24 hours) at 06/20/2019 1140 Last data filed at 06/20/2019 1000 Gross per 24 hour  Intake 1265.08 ml  Output 1400 ml  Net -134.92 ml   Filed Weights   06/18/19 1408 06/19/19 0405  Weight: 80.2 kg 97.9 kg    Physical Examination: General: Intubated, sedated,  lying in bed Eyes: Pupils equal and reactive, roving eye movements. Anicteric sclera. ENT: ETT in place CV: Normal rate, normal S1, S2, no murmurs appreciated Pulm: Clear to anterior auscultation bilaterally.  Abdomen: Soft, non-tender, positive bowel sounds.  Extremities: No cyanosis, clubbing, or edema; 2+ dorsalis pedis pulses bilaterally Skin: Warm and dry Neuro: Not responding to voice, not following commands. GU: Condom catheter in place, draining normal appearing urine.   Resolved Hospital Problem list   Status epilepticus.  Assessment & Plan:   Critically ill due to acute hypoxic, hypercapnic respiratory failure with compromised airway, requiring mechanical ventilation. - SBT today. - Will stop propofol and fentanyl to evaluate whether mental status will permit extubation.  Acute metabolic encephalopathy. Seizure with reported hx of ETOH. Etiology of seizure remains unclear at this time. Differential includes alcohol withdrawal, new-onset seizures. Appreciate Neurology assistance. - Neurology consulted - S/p cEEG with no seizure or epileptiform activity; there was mild diffuse encephalopathy - Continue Keppra - Continue to wean sedation - Continue thiamine, folic acid, MVI  Aspiration pneumonitis. Temperature elevated to 37.8 today (8/17), but no leukocytosis or worsening respiratory status.  - F/u sputum culture. - Continue Unasyn (8/15- ).  Thrombocytopenia Platelets are down to 128 from 196 on admission. - Continue to monitor.   Hx of HLD. - continue lipitor  Hx of hypothyroidism. -TSH on 8/15 wnl (3.13) - continue synthroid  Hx of depression. - hold outpt zoloft  Hx of allergies. -  continue singulair - hold zyrtec  Best practice:  Diet: NPO DVT prophylaxis: lovenox GI prophylaxis: pepcid Mobility: bed rest Code Status: full code Disposition: ICU  Labs   CBC: Recent Labs  Lab 06/18/19 1425  06/18/19 1741 06/18/19 1935 06/19/19 0307  06/19/19 0628 06/20/19 0901  WBC 7.9  --  11.1*  --   --  7.4 5.8  NEUTROABS 4.0  --   --   --   --   --   --   HGB 15.4   < > 14.5 13.3 12.9* 13.2 13.4  HCT 45.2   < > 41.4 39.0 38.0* 38.9* 39.0  MCV 97.8  --  95.8  --   --  96.8 98.7  PLT 196  --  168  --   --  129* 128*   < > = values in this interval not displayed.    Basic Metabolic Panel: Recent Labs  Lab 06/18/19 1425 06/18/19 1435 06/18/19 1544 06/18/19 1741 06/18/19 1935 06/19/19 0307 06/19/19 0628 06/20/19 0901  NA 137 139 140  --  140 141  --  139  K 4.1 4.1 4.1  --  3.6 3.4*  --  3.5  CL 103  --   --   --   --   --   --  109  CO2 18*  --   --   --   --   --   --  19*  GLUCOSE 131*  --   --   --   --   --   --  100*  BUN 14  --   --   --   --   --   --  16  CREATININE 1.18  --   --   --   --   --   --  1.27*  CALCIUM 9.0  --   --   --   --   --   --  8.0*  MG  --   --   --  2.3  --   --  2.3 2.5*  PHOS  --   --   --  2.2*  --   --  3.0 3.9   GFR: Estimated Creatinine Clearance: 60.5 mL/min (A) (by C-G formula based on SCr of 1.27 mg/dL (H)). Recent Labs  Lab 06/18/19 1425 06/18/19 1741 06/19/19 0628 06/20/19 0901  PROCALCITON  --  <0.10  --   --   WBC 7.9 11.1* 7.4 5.8  LATICACIDVEN  --  2.6*  --   --     Liver Function Tests: Recent Labs  Lab 06/18/19 1425  AST 44*  ALT 33  ALKPHOS 74  BILITOT 0.9  PROT 6.8  ALBUMIN 4.2   Recent Labs  Lab 06/18/19 1425  LIPASE 40   No results for input(s): AMMONIA in the last 168 hours.  ABG    Component Value Date/Time   PHART 7.426 06/19/2019 0307   PCO2ART 31.3 (L) 06/19/2019 0307   PO2ART 119.0 (H) 06/19/2019 0307   HCO3 20.5 06/19/2019 0307   TCO2 21 (L) 06/19/2019 0307   ACIDBASEDEF 3.0 (H) 06/19/2019 0307   O2SAT 99.0 06/19/2019 0307     Coagulation Profile: Recent Labs  Lab 06/18/19 1741  INR 1.0    Cardiac Enzymes: No results for input(s): CKTOTAL, CKMB, CKMBINDEX, TROPONINI in the last 168 hours.   HbA1C: No results found  for: HGBA1C  CBG: Recent Labs  Lab 06/19/19 0353 06/19/19 1952 06/19/19 2311 06/20/19 0308  06/20/19 [place], MS4

## 2019-06-20 NOTE — Progress Notes (Addendum)
Reason for consult: Seizure   Subjective: Agitated overnight. Switched to precedex overnight. Remains on fentanyl gtt. No further seizure activity. EEG suggestive of mild diffuse encephalopathy, but no seizures or epileptiform discharges.   ROS: negative except above Unable to obtain due to poor mental status   Examination  Vital signs in last 24 hours: Temp:  [97.8 F (36.6 C)-100.1 F (37.8 C)] 100.1 F (37.8 C) (08/17 0800) Pulse Rate:  [59-96] 62 (08/17 0900) Resp:  [18-26] 24 (08/17 0900) BP: (98-134)/(59-85) 106/66 (08/17 0900) SpO2:  [94 %-100 %] 95 % (08/17 0900) FiO2 (%):  [40 %] 40 % (08/17 0808)  General: lying in bed, restless when awake but in no acute distress  CVS: pulse-normal rate and rhythm RS: comfortable breathing on the vent  Extremities: normal   Neuro:  MS: Easily arouses to voice, unable to answer questions due to ETT, does not follow commands  CN: pupils equal and reactive, face is symmetric  Motor: moves all 4 extremities spontaneously  Reflexes: 2+ bilaterally over patella, biceps Coordination: unable to assess Gait: not tested  Basic Metabolic Panel: Recent Labs  Lab 06/18/19 1425 06/18/19 1435 06/18/19 1544 06/18/19 1741 06/18/19 1935 06/19/19 0307 06/19/19 0628 06/20/19 0901  NA 137 139 140  --  140 141  --  139  K 4.1 4.1 4.1  --  3.6 3.4*  --  3.5  CL 103  --   --   --   --   --   --  109  CO2 18*  --   --   --   --   --   --  19*  GLUCOSE 131*  --   --   --   --   --   --  100*  BUN 14  --   --   --   --   --   --  16  CREATININE 1.18  --   --   --   --   --   --  1.27*  CALCIUM 9.0  --   --   --   --   --   --  8.0*  MG  --   --   --  2.3  --   --  2.3 2.5*  PHOS  --   --   --  2.2*  --   --  3.0 3.9    CBC: Recent Labs  Lab 06/18/19 1425  06/18/19 1741 06/18/19 1935 06/19/19 0307 06/19/19 0628 06/20/19 0901  WBC 7.9  --  11.1*  --   --  7.4 5.8  NEUTROABS 4.0  --   --   --   --   --   --   HGB 15.4   < > 14.5 13.3  12.9* 13.2 13.4  HCT 45.2   < > 41.4 39.0 38.0* 38.9* 39.0  MCV 97.8  --  95.8  --   --  96.8 98.7  PLT 196  --  168  --   --  129* 128*   < > = values in this interval not displayed.     Coagulation Studies: Recent Labs    06/18/19 1741  LABPROT 13.0  INR 1.0    Imaging Reviewed:  Head CT 8/15 with generalized parenchymal volume loss and dilatation of the ventricles and sulci but no acute intracranial pathology.   ASSESSMENT AND PLAN  Mr. Marotto is a 73 year old with a history of depression, hypothyroidism, concussion 15 years ago, and  possible alcohol use disorder who presented from independent living facility after having 1 seizure.  He required intubation in the ED due to inability to protect his airway.  Etiology of seizure activity remains unclear, but it is possible that is secondary to alcohol withdrawal versus new onset seizures.  LTM yesterday without evidence of seizure or epileptic activity. No evidence electrolyte normalities, CVA on imaging, or infection prior to seizure onset. TSH within normal limits.  Impression  Status epilepticus-resolved New onset seizure vs EtOH withdrawal  Aspiration pneumonia/pneumonitis   - Continue Keppra 500mg  BID - IV Ativan for breakthrough seizures - Seizure precautions  - Continue MVI + folate + thiamine  - Continue Unasyn for aspiration pneumonia, fever curve trending up    NEUROHOSPITALIST ADDENDUM Performed a face to face diagnostic evaluation.   I have reviewed the contents of history and physical exam as documented by PA/ARNP/Resident and agree with above documentation.  I have discussed and formulated the above plan as documented. Edits to the note have been made as needed.  Patient is awake and intermittently following commands.  He is trying to self extubate. No further clinical seizure activity has been noted.  Suspect initial presentation with status epilepticus likely from alcohol withdrawal however unclear at  this point.  Currently seizure-free on 500 mg twice daily of Keppra.  Karena Addison Kervens Roper [facility] Pager Number [phone] For questions after 7pm please refer to AMION to reach the Neurologist on call

## 2019-06-21 LAB — GLUCOSE, CAPILLARY
Glucose-Capillary: 97 mg/dL (ref 70–99)
Glucose-Capillary: 123 mg/dL — ABNORMAL HIGH (ref 70–99)
Glucose-Capillary: 103 mg/dL — ABNORMAL HIGH (ref 70–99)
Glucose-Capillary: 94 mg/dL (ref 70–99)

## 2019-06-21 LAB — MRSA PCR SCREENING: MRSA by PCR: NEGATIVE

## 2019-06-21 MED ORDER — MIDAZOLAM HCL 2 MG/2ML IJ SOLN: 1.0000 mg | Freq: Once | INTRAMUSCULAR | Status: DC

## 2019-06-21 MED ORDER — THIAMINE HCL 100 MG/ML IJ SOLN
100.0000 mg | INTRAMUSCULAR | Status: DC
  Administered 2019-06-21 – 2019-06-22 (×2): 100 mg via INTRAVENOUS
  Filled 2019-06-21 (×2): qty 2

## 2019-06-21 NOTE — Progress Notes (Signed)
[facility] Progress Note Patient Name: Robert Cummings. DOB: Feb 07, 1946 MRN: [id number]   Date of Service  06/21/2019  HPI/Events of Note  Agitation - Request for sedation for MRI.   eICU Interventions  Will order: 1. Versed 1 mg IV on call to MRI.     Intervention Category Major Interventions: Delirium, psychosis, severe agitation - evaluation and management  Sommer,Steven Eugene 06/21/2019, 11:14 PM

## 2019-06-21 NOTE — Plan of Care (Signed)
Pt restless, forgetful and confused throughout shift. Reoriented pt several times. Pt able to pass [employer] Screen and now has regular diet with thin liquids. Able to feed self.

## 2019-06-21 NOTE — Progress Notes (Signed)
NAME:  Robert Cummings, Robert Cummings MRN:  [id number], DOB:  1946-07-18, LOS: 3 ADMISSION DATE:  06/18/2019, CONSULTATION DATE:  06/18/2019 REFERRING MD:  Dr. Billy Fischer, ER, CHIEF COMPLAINT: seizure  Brief History   73 yo male with PMHx of depression, possible alcohol use disorder, and remote history of concussion who presented from [facility] with seizure. Required intubation for airway protection in ED. Had c/o headache recently.  Hx from chart and ER staff.  Pt unable to provide Hx.  Past Medical History  HLD, Allergies, Hypothyroidism, Depression  Significant Hospital Events   8/15 Admit 8/17 Extubated  Consults:  Neurology 8/15 seizure  Procedures:  ETT 8/15 >> 8/17  Significant Diagnostic Tests:  CT head 8/15 >> no acute findings CXR 8/16 >> lines in place, no airspace disease. Micro Data:  COVID 8/15 >> negative Sputum 8/15 >>   Antimicrobials:  Unasyn 8/15 >> 8/17  Interim history/subjective:  In the interim, patient self-extubated. Currently on 6L O2 [place]. No acute events overnight. Robert Cummings reports that his breathing feels better today. Denies chest pain and shortness of breath. Patient states he has no concerns at this time and is looking forward to being able to eat.  Patient did have some inappropriate laughter during interview and would not state his name when asked. Oriented to place, but when asked the year he replied "2001." Patient was also unable to state where he was living prior to hospitalization when asked. He did correctly recite the months of the year backwards.  Objective   Blood pressure (!) 144/72, pulse (!) 52, temperature 99.1 F (37.3 C), temperature source Oral, resp. rate (!) 25, height 5\' 8"  (1.727 m), weight 97.9 kg, SpO2 91 %.    Vent Mode: CPAP;PSV FiO2 (%):  [40 %] 40 % PEEP:  [5 cmH20] 5 cmH20 Pressure Support:  [10 cmH20] 10 cmH20 Plateau Pressure:  [15 cmH20] 15 cmH20   Intake/Output Summary (Last 24 hours) at  06/21/2019 0943 Last data filed at 06/21/2019 0600 Gross per 24 hour  Intake 769.6 ml  Output 1300 ml  Net -530.4 ml   Filed Weights   06/18/19 1408 06/19/19 0405  Weight: 80.2 kg 97.9 kg    Physical Examination: General: Lying comfortably in bed, in no acute distress. Inappropriate laughter at times. Eyes: Pupils equal and reactive. EOMI intact. Anicteric sclera. HENT: Normocephalic and atraumatic. Oropharynx clear. Poor dentition. CV: Normal rate, normal S1, S2, no murmurs appreciated Pulm: Clear to auscultation bilaterally. Normal work of breathing on 6L O2 [place]. Abdomen: Soft, non-tender, positive bowel sounds.  Extremities: No cyanosis, clubbing, or edema; 2+ peripheral pulses bilaterally. Skin: Warm and dry Neuro: Alert and oriented to place. When asked his name, patient laughed and did not respond. When asked the year, patient said 2001. Unable to state where he was living prior to hospitalization. Strength 5/5 in bilateral upper and lower extremities. Normal sensation to light touch bilaterally. Able to recite months of the year backwards. GU: Condom catheter in place, draining normal appearing urine.   Resolved Hospital Problem list   Status epilepticus.  Assessment & Plan:   Acute hypoxic, hypercapnic respiratory failure - Extubated on 06/20/19, now on 6L [place]. - Wean O2 as tolerated.  Acute metabolic encephalopathy. Seizure with reported hx of EtOH. Etiology of seizure remains unclear. Differential includes alcohol withdrawal, new-onset seizures. Appreciate Neurology assistance. - Neurology consulted. - S/p cEEG with no seizure or epileptiform activity; there was mild diffuse encephalopathy. - Continue Keppra. - Continue  thiamine, folic acid, MVI. - PT/OT.  C/f aspiration pneumonitis Patient is afebrile (8/18), stable respiratory status on 6L O2 [place]. Antibiotics discontinued given normal procalcitonin level. - S/p Unasyn (8/15-8/17). - Wean O2 as tolerated.   Thrombocytopenia Platelets decreased since admission. - Continue to monitor.   Nutrition Patient passed SLP evaluation. - Start regular diet.   Hx of HLD - Continue Lipitor  Hx of hypothyroidism. - TSH on 8/15 wnl (3.13) - Continue Synthroid  Hx of depression. - Restart home Zoloft  Hx of allergies. - continue singulair - Restart home Zyrtec  Best practice:  Diet: Regular diet DVT prophylaxis: lovenox GI prophylaxis: pepcid Mobility: Out of bed with assistance Code Status: full code Disposition: Transfer to hospitalist's service  Labs   CBC: Recent Labs  Lab 06/18/19 1425  06/18/19 1741 06/18/19 1935 06/19/19 0307 06/19/19 0628 06/20/19 0901  WBC 7.9  --  11.1*  --   --  7.4 5.8  NEUTROABS 4.0  --   --   --   --   --   --   HGB 15.4   < > 14.5 13.3 12.9* 13.2 13.4  HCT 45.2   < > 41.4 39.0 38.0* 38.9* 39.0  MCV 97.8  --  95.8  --   --  96.8 98.7  PLT 196  --  168  --   --  129* 128*   < > = values in this interval not displayed.    Basic Metabolic Panel: Recent Labs  Lab 06/18/19 1425 06/18/19 1435 06/18/19 1544 06/18/19 1741 06/18/19 1935 06/19/19 0307 06/19/19 0628 06/20/19 0901  NA 137 139 140  --  140 141  --  139  K 4.1 4.1 4.1  --  3.6 3.4*  --  3.5  CL 103  --   --   --   --   --   --  109  CO2 18*  --   --   --   --   --   --  19*  GLUCOSE 131*  --   --   --   --   --   --  100*  BUN 14  --   --   --   --   --   --  16  CREATININE 1.18  --   --   --   --   --   --  1.27*  CALCIUM 9.0  --   --   --   --   --   --  8.0*  MG  --   --   --  2.3  --   --  2.3 2.5*  PHOS  --   --   --  2.2*  --   --  3.0 3.9   GFR: Estimated Creatinine Clearance: 60.5 mL/min (A) (by C-G formula based on SCr of 1.27 mg/dL (H)). Recent Labs  Lab 06/18/19 1425 06/18/19 1741 06/19/19 0628 06/20/19 0901  PROCALCITON  --  <0.10  --   --   WBC 7.9 11.1* 7.4 5.8  LATICACIDVEN  --  2.6*  --   --     Liver Function Tests: Recent Labs  Lab 06/18/19 1425   AST 44*  ALT 33  ALKPHOS 74  BILITOT 0.9  PROT 6.8  ALBUMIN 4.2   Recent Labs  Lab 06/18/19 1425  LIPASE 40   No results for input(s): AMMONIA in the last 168 hours.  ABG    Component Value Date/Time  PHART 7.426 06/19/2019 0307   PCO2ART 31.3 (L) 06/19/2019 0307   PO2ART 119.0 (H) 06/19/2019 0307   HCO3 20.5 06/19/2019 0307   TCO2 21 (L) 06/19/2019 0307   ACIDBASEDEF 3.0 (H) 06/19/2019 0307   O2SAT 99.0 06/19/2019 0307     Coagulation Profile: Recent Labs  Lab 06/18/19 1741  INR 1.0    Cardiac Enzymes: No results for input(s): CKTOTAL, CKMB, CKMBINDEX, TROPONINI in the last 168 hours.   HbA1C: No results found for: HGBA1C  CBG: Recent Labs  Lab 06/20/19 1556 06/20/19 1940 06/20/19 2332 06/21/19 0324 06/21/19 [place]    Atilano Ina, MS4

## 2019-06-21 NOTE — Progress Notes (Signed)
Patient arrived to 3w. A&O x3. States no pain. Tele has been placed. Bed in lowest position. Call light within reach and explained how to use. Nurse will continue to monitor. [place]

## 2019-06-21 NOTE — Progress Notes (Signed)
  Speech Language Pathology  Patient Details Name: Robert Cummings. MRN: [id number] DOB: 10-25-1946 Today's Date: 06/21/2019 Time:  -      RN informed this therapist that she administered the [facility] swallow screen this morning which pt passed. No need for formal swallow assessment at this time.                [name] 06/21/2019, 10:08 AM  [name] Risk analyst [phone] Office [phone]

## 2019-06-21 NOTE — Progress Notes (Addendum)
Reason for consult: Seizures  Subjective: Self extubated yesterday.  No complications.  Has been alert and awake and interacting with staff.  He does not recall the events prior to admission.  I try to elicit alcohol use history.  He states he does drink alcohol frequently but not that much.  He is unable to quantify how much.  Denies history of withdrawals in the past but is unsure.  He feels hungry this morning and is asking for diet, otherwise no acute complaints.   ROS: negative except above  Examination  Vital signs in last 24 hours: Temp:  [97.7 F (36.5 C)-101 F (38.3 C)] 99.1 F (37.3 C) (08/18 0800) Pulse Rate:  [51-75] 52 (08/18 0800) Resp:  [14-32] 14 (08/18 1000) BP: (112-169)/(71-111) 130/74 (08/18 1000) SpO2:  [71 %-97 %] 91 % (08/18 0800) FiO2 (%):  [40 %] 40 % (08/17 1219)  General: lying in bed in no acute distress  CVS: pulse-normal rate and rhythm RS: breathing comfortably on 4L O2 Extremities: normal   Neuro: MS: Alert, oriented, follows commands CN: pupils equal and reactive,  EOMI, face symmetric, tongue midline, normal sensation over face, Motor: 5/5 strength in all 4 extremities Reflexes: 2+ bilaterally over patella, biceps  Coordination: normal Gait: not tested  Basic Metabolic Panel: Recent Labs  Lab 06/18/19 1425 06/18/19 1435 06/18/19 1544 06/18/19 1741 06/18/19 1935 06/19/19 0307 06/19/19 0628 06/20/19 0901  NA 137 139 140  --  140 141  --  139  K 4.1 4.1 4.1  --  3.6 3.4*  --  3.5  CL 103  --   --   --   --   --   --  109  CO2 18*  --   --   --   --   --   --  19*  GLUCOSE 131*  --   --   --   --   --   --  100*  BUN 14  --   --   --   --   --   --  16  CREATININE 1.18  --   --   --   --   --   --  1.27*  CALCIUM 9.0  --   --   --   --   --   --  8.0*  MG  --   --   --  2.3  --   --  2.3 2.5*  PHOS  --   --   --  2.2*  --   --  3.0 3.9    CBC: Recent Labs  Lab 06/18/19 1425  06/18/19 1741 06/18/19 1935 06/19/19 0307  06/19/19 0628 06/20/19 0901  WBC 7.9  --  11.1*  --   --  7.4 5.8  NEUTROABS 4.0  --   --   --   --   --   --   HGB 15.4   < > 14.5 13.3 12.9* 13.2 13.4  HCT 45.2   < > 41.4 39.0 38.0* 38.9* 39.0  MCV 97.8  --  95.8  --   --  96.8 98.7  PLT 196  --  168  --   --  129* 128*   < > = values in this interval not displayed.     Coagulation Studies: Recent Labs    06/18/19 1741  LABPROT 13.0  INR 1.0    Imaging No new imaging to review.     ASSESSMENT AND PLAN  Robert Cummings is a 73 year old with a history of depression,  hypothyroidism, concussion 15 years ago, and possible alcohol use disorder who presented from independent living facility after having 1 seizure.  He required intubation in the ED due to inability to protect his airway.  Etiology of seizure activity remains unclear, but it is possible that is secondary to alcohol withdrawal versus new onset seizures.  Self extubated yesterday, comfortable on 4L of supplemental O2.  Unable to provide alcohol use history, but mentation seems back to baseline and neuro exam unremarkable.   Impression  Status epilepticus-resolved New onset seizure vs EtOH withdrawal  Aspiration pneumonia/pneumonitis   - Given unclear etiology for seizure activity would recommend continuing Keppra 500 mg twice daily until he follows up with outpatient neurology.  Switch to p.o. if able to tolerate oral intake - Seizure precautions - Continue MVI + folate + thiamine   Final recommendations per neurology attending, Dr. Lorraine Lax.   Welford Roche, MD  Internal Medicine PGY-3  P [phone]   NEUROHOSPITALIST ADDENDUM Performed a face to face diagnostic evaluation.   I have reviewed the contents of history and physical exam as documented by the Resident and agree with above documentation.  I have discussed and formulated the above plan as documented. Edits to the note have been made as needed.  Patient extubated.  He is alert and oriented  to place person but still remains slightly confused.  Continue Keppra.  MRI brain ordered and pending.  We will follow-up on MRI brain.  Patient does drink 2 to 3 glasses of wine daily, does not appear to be in withdrawal currently.    Karena Addison Aroor MD [facility] [phone]   If 7pm to 7am, please call on call as listed on AMION.

## 2019-06-22 ENCOUNTER — Ambulatory Visit: Payer: Medicare Other | Admitting: Neurology

## 2019-06-22 DIAGNOSIS — R55 Syncope and collapse: Secondary | ICD-10-CM

## 2019-06-22 LAB — BASIC METABOLIC PANEL
Anion gap: 11 (ref 5–15)
BUN: 14 mg/dL (ref 8–23)
CO2: 21 mmol/L — ABNORMAL LOW (ref 22–32)
Calcium: 8.4 mg/dL — ABNORMAL LOW (ref 8.9–10.3)
Chloride: 108 mmol/L (ref 98–111)
Creatinine, Ser: 0.92 mg/dL (ref 0.61–1.24)
GFR calc Af Amer: >60 mL/min (ref >60)
GFR calc non Af Amer: >60 mL/min (ref >60)
Glucose, Bld: 93 mg/dL (ref 70–99)
Potassium: 3.3 mmol/L — ABNORMAL LOW (ref 3.5–5.1)
Sodium: 140 mmol/L (ref 135–145)

## 2019-06-22 LAB — CBC
HCT: 37.8 % — ABNORMAL LOW (ref 39.0–52.0)
Hemoglobin: 12.8 g/dL — ABNORMAL LOW (ref 13.0–17.0)
MCH: 33.2 pg (ref 26.0–34.0)
MCHC: 33.9 g/dL (ref 30.0–36.0)
MCV: 97.9 fL (ref 80.0–100.0)
Platelets: 149 10*3/uL — ABNORMAL LOW (ref 150–400)
RBC: 3.86 MIL/uL — ABNORMAL LOW (ref 4.22–5.81)
RDW: 12.3 % (ref 11.5–15.5)
WBC: 8.6 10*3/uL (ref 4.0–10.5)
nRBC: 0 % (ref 0.0–0.2)

## 2019-06-22 LAB — MAGNESIUM: Magnesium: 1.8 mg/dL (ref 1.7–2.4)

## 2019-06-22 LAB — PHOSPHORUS: Phosphorus: 2.2 mg/dL — ABNORMAL LOW (ref 2.5–4.6)

## 2019-06-22 MED ORDER — MONTELUKAST SODIUM 10 MG PO TABS
10.0000 mg | ORAL_TABLET | Freq: Every day | ORAL | Status: DC
  Administered 2019-06-22: 22:00:00 10 mg via ORAL
  Filled 2019-06-22: qty 1

## 2019-06-22 MED ORDER — FAMOTIDINE 20 MG PO TABS
20.0000 mg | ORAL_TABLET | Freq: Two times a day (BID) | ORAL | Status: DC
  Administered 2019-06-22 – 2019-06-23 (×4): 20 mg via ORAL
  Filled 2019-06-22 (×3): qty 1

## 2019-06-22 MED ORDER — K PHOS MONO-SOD PHOS DI & MONO 155-852-130 MG PO TABS
500.0000 mg | ORAL_TABLET | Freq: Three times a day (TID) | ORAL | Status: DC
  Administered 2019-06-22 (×3): 500 mg via ORAL
  Filled 2019-06-22 (×3): qty 2

## 2019-06-22 MED ORDER — POTASSIUM CHLORIDE CRYS ER 20 MEQ PO TBCR
40.0000 meq | EXTENDED_RELEASE_TABLET | Freq: Once | ORAL | Status: AC
  Administered 2019-06-22: 11:00:00 40 meq via ORAL
  Filled 2019-06-22: qty 2

## 2019-06-22 MED ORDER — LORAZEPAM 0.5 MG PO TABS
0.5000 mg | ORAL_TABLET | Freq: Once | ORAL | Status: AC
  Administered 2019-06-22: 22:00:00 0.5 mg via ORAL
  Filled 2019-06-22: qty 1

## 2019-06-22 MED ORDER — ACETAMINOPHEN 325 MG PO TABS: 650.0000 mg | ORAL_TABLET | Freq: Four times a day (QID) | ORAL | Status: DC | PRN

## 2019-06-22 MED ORDER — ATORVASTATIN CALCIUM 10 MG PO TABS
20.0000 mg | ORAL_TABLET | Freq: Every day | ORAL | Status: DC
  Administered 2019-06-22: 22:00:00 20 mg via ORAL
  Filled 2019-06-22: qty 2

## 2019-06-22 NOTE — Plan of Care (Signed)
  Problem: Activity: Goal: Risk for activity intolerance will decrease Outcome: Progressing   

## 2019-06-22 NOTE — Evaluation (Signed)
Occupational Therapy Evaluation Patient Details Name: Robert Cummings. MRN: [id number] DOB: [birth year] Today's Date: 06/22/2019    History of Present Illness 73 year old male with history of depression, chronic alcohol abuse, remote history of concussion who presents from [facility]  independent living with seizures.    Clinical Impression   Pt admitted with the above diagnoses and presents with below problem list. Pt will benefit from continued acute OT to address the below listed deficits and maximize independence with basic ADLs prior to d/c to venue below. Pt is from ILF. Per chart review and clinical judgement pt at baseline is supervision for mobility and setup-supervision ADLs. Pt is unable to provide reliable PLOF data and no caregiver present. Pt presents with impaired balance, decreased safety awareness, decreased insight into deficits, and impulsivity. Pt currently is min guard to min A with LB ADLs and functional transfers/mobility. I anticipate he will be close enough to his baseline by the time of d/c to be able to return to familiar setting of his ILF with [facility] therapy recommended.      Follow Up Recommendations  Home health OT;Supervision/Assistance - 24 hour (back to ILF)    Equipment Recommendations  None recommended by OT    Recommendations for Other Services PT consult     Precautions / Restrictions Precautions Precautions: Fall;Other (comment)(seizure) Restrictions Weight Bearing Restrictions: No      Mobility Bed Mobility Overal bed mobility: Needs Assistance Bed Mobility: Supine to Sit;Sit to Supine     Supine to sit: Min guard Sit to supine: Min guard   General bed mobility comments: Pt attempting to get out of bed with padded rails up. Min guard for safety and assist with making sure environment was safe to mobilize (putting rail down, moving blanket out of the way)  Transfers Overall transfer level: Needs assistance Equipment used: None;Rolling  walker (2 wheeled) Transfers: Sit to/from Stand Sit to Stand: Min guard;Min assist         General transfer comment: steadying assist. Initial used no AD to go to/from bathroom. Rw used to walk in the hall.    Balance Overall balance assessment: Needs assistance Sitting-balance support: No upper extremity supported;Feet supported Sitting balance-Leahy Scale: Fair     Standing balance support: No upper extremity supported;Bilateral upper extremity supported;Single extremity supported Standing balance-Leahy Scale: Fair Standing balance comment: initially used no AD in the room, occasional external support during dynamic tasks. Utilized BUE support of rw for walking in the hallway.                            ADL either performed or assessed with clinical judgement   ADL Overall ADL's : Needs assistance/impaired Eating/Feeding: Set up;Sitting   Grooming: Set up;Minimal assistance;Standing   Upper Body Bathing: Set up;Minimal assistance;Sitting   Lower Body Bathing: Minimal assistance;Sit to/from stand   Upper Body Dressing : Set up;Minimal assistance;Sitting   Lower Body Dressing: Minimal assistance;Sit to/from stand   Toilet Transfer: Min guard;Ambulation;RW;Comfort height toilet;Grab bars   Toileting- Clothing Manipulation and Hygiene: Minimal assistance;Min guard;Sit to/from stand   Tub/ Shower Transfer: Minimal assistance;Min guard;Ambulation;3 in 1;Rolling walker   Functional mobility during ADLs: Min guard;Minimal assistance;Rolling walker General ADL Comments: Pt min A with LB ADLs, min guard to min A with functional mobility. Walked household distance, steadying assist on turns.      Vision         Perception     Praxis  Pertinent Vitals/Pain Pain Assessment: No/denies pain     Hand Dominance     Extremity/Trunk Assessment Upper Extremity Assessment Upper Extremity Assessment: Generalized weakness   Lower Extremity Assessment Lower  Extremity Assessment: Defer to PT evaluation       Communication Communication Communication: No difficulties   Cognition Arousal/Alertness: Awake/alert Behavior During Therapy: Impulsive Overall Cognitive Status: History of cognitive impairments - at baseline                                 General Comments: Able to state that he was in [place]at a [facility]" surprised to learn he was at the hospital. Stated year correctly. Impulsive and decreased safety awareness.    General Comments       Exercises     Shoulder Instructions      Home Living Family/patient expects to be discharged to:: Private residence     Type of Home: Independent living facility                           Additional Comments: Pt reports he does not use AD for ambulating but not a reliable historian. Per chart review pt was walking in the hall at [facility].      Prior Functioning/Environment          Comments: per chart review pt was [facility] hall [facility]. Per conversation with nurse who spoke with pt's brother pt was primarily placed in ILF because of impaired cognition.         OT Problem List: Decreased strength;Decreased activity tolerance;Impaired balance (sitting and/or standing);Decreased cognition;Decreased safety awareness;Decreased knowledge of use of DME or AE;Decreased knowledge of precautions      OT Treatment/Interventions: Self-care/ADL training;Therapeutic exercise;Energy conservation;DME and/or AE instruction;Therapeutic activities;Patient/family education;Balance training;Cognitive remediation/compensation    OT Goals(Current goals can be found in the care plan section) Acute Rehab OT Goals Patient Stated Goal: not stated.  OT Goal Formulation: With patient Time For Goal Achievement: 07/06/19 Potential to Achieve Goals: Good ADL Goals Pt Will Perform Grooming: with set-up;sitting;with supervision;standing Pt Will Perform Upper  Body Bathing: with set-up;sitting;with supervision Pt Will Perform Lower Body Bathing: with supervision;with set-up;sit to/from stand Pt Will Perform Upper Body Dressing: with set-up;with supervision;sitting Pt Will Perform Lower Body Dressing: with supervision;sit to/from stand Pt Will Transfer to Toilet: with supervision;ambulating Pt Will Perform Toileting - Clothing Manipulation and hygiene: with supervision;sit to/from stand  OT Frequency: Min 2X/week   Barriers to D/C:            Co-evaluation              AM-PAC OT "6 Clicks" Daily Activity     Outcome Measure Help from another person eating meals?: A Little Help from another person taking care of personal grooming?: A Little Help from another person toileting, which includes using toliet, bedpan, or urinal?: A Little Help from another person bathing (including washing, rinsing, drying)?: A Little Help from another person to put on and taking off regular upper body clothing?: A Little Help from another person to put on and taking off regular lower body clothing?: A Little 6 Click Score: 18   End of Session Equipment Utilized During Treatment: Gait belt Nurse Communication: Mobility status;Precautions  Activity Tolerance: Patient tolerated treatment well Patient left: in bed;with call bell/phone within reach;with bed alarm set(restraints off per conversation with RN. padded  bed rails up)  OT Visit Diagnosis: Unsteadiness on feet (R26.81);Other abnormalities of gait and mobility (R26.89);Muscle weakness (generalized) (M62.81);Other symptoms and signs involving cognitive function                Time: 0952-1010 OT Time Calculation (min): 18 min Charges:  OT General Charges $OT Visit: 1 Visit OT Evaluation $OT Eval Low Complexity: Bricelyn, OT Acute Rehabilitation Services Pager: [phone] Office: [phone]   Hortencia Pilar 06/22/2019, 10:37 AM

## 2019-06-22 NOTE — Progress Notes (Signed)
06/22/19 1200  PT Visit Information  Last PT Received On 06/22/19  Assistance Needed +1  History of Present Illness Pt is a 73 year old male presenting s/p seizure, acute metabolic encephalopathy. EEG was nonspecific, but showed mild diffuse metabolic encephalopathy. Pt was intubated in ER on 8/15. Pt self extubated on 8/17. Relevant PMH includes HLD, hypothyroidism, depression, and hx of TBI.  Precautions  Precautions Fall;Other (comment) (seizure)  Restrictions  Weight Bearing Restrictions No  Home Living  Family/patient expects to be discharged to: Assisted living  Available Help at Discharge Other (Comment) (Pt lives in assisted living facility.)  Type of Home Assisted living  Bathroom Shower/Tub Tub/shower unit  [facility] - single point  Additional Comments Pt not a reliable historian, so uncertain of credibility of information. Per RN, Pt from ALF.  Prior Function  Level of Independence Independent  Comments Pt reports he was walking independently, however, credibility uncertain.  Communication  Communication No difficulties  Pain Assessment  Pain Assessment No/denies pain  Cognition  Arousal/Alertness Awake/alert  Behavior During Therapy WFL for tasks assessed/performed  Overall Cognitive Status History of cognitive impairments - at baseline  General Comments Pt able to state name/DOB & year, but unable to state hospital or situation resulting in hospitalization. During ambulation, pt demonstrated decreased safety awareness, slow processing, poor memory, and difficutly sequencing.  Upper Extremity Assessment  Upper Extremity Assessment Defer to OT evaluation  Lower Extremity Assessment  Lower Extremity Assessment Generalized weakness  Cervical / Trunk Assessment  Cervical / Trunk Assessment Normal  Bed Mobility  Overal bed mobility Needs Assistance  Bed Mobility Supine to Sit  Supine to sit Min guard  General bed  mobility comments Pt requires VCs and tactile cues to sit edge of bed secondary to difficulty sequencing. Pt able to perform independently.  Transfers  Overall transfer level Needs assistance  Transfers Sit to/from Stand  Sit to Stand Min assist  General transfer comment Pt requires lift assistance to perform sit to stand transfer with RW. VCs required for hand placement on RW and bed.  Ambulation/Gait  Ambulation/Gait assistance Min guard  Gait Distance (Feet) 200 Feet  Assistive device Rolling walker (2 wheeled)  Gait Pattern/deviations Step-through pattern;Staggering right;Drifts right/left (drifts R)  General Gait Details Pt ambulates at min G level with step through pattern. Pt tends to drift towards R with ambulation and requires cue not to run into side of wall. Pt demonstrated difficulty dual tasking. While ambulating, cognitive challenges including identifying colors and remembering room number were posed to the pt. He was able to identify colors, but stopped ambulating. He was unable to relocate his room even with max cues of direction to room and room number. Pt was easily distracted and required cues to keep hands on RW.  Gait velocity decreased  Balance  Overall balance assessment Needs assistance  Sitting-balance support No upper extremity supported;Feet supported  Sitting balance-Leahy Scale Fair  Standing balance support Bilateral upper extremity supported  Standing balance-Leahy Scale Poor  Standing balance comment pt requires external support including UE support to maintain standing balance.  PT - End of Session  Equipment Utilized During Treatment Gait belt  Activity Tolerance Patient tolerated treatment well  Patient left in chair;with call bell/phone within reach;with chair alarm set  Nurse Communication Mobility status  PT Assessment  PT Recommendation/Assessment Patient needs continued PT services  PT Visit Diagnosis Unsteadiness on feet (R26.81);Other  abnormalities of gait and mobility (R26.89);Muscle weakness (generalized) (M62.81)  PT Problem List Decreased strength;Decreased balance;Decreased mobility;Decreased cognition;Decreased knowledge of use of DME;Decreased safety awareness;Decreased knowledge of precautions  PT Plan  PT Frequency (ACUTE ONLY) Min 3X/week  PT Treatment/Interventions (ACUTE ONLY) DME instruction;Gait training;Stair training;Functional mobility training;Therapeutic activities;Therapeutic exercise;Balance training;Patient/family education;Cognitive remediation  AM-PAC PT "6 Clicks" Mobility Outcome Measure (Version 2)  Help needed turning from your back to your side while in a flat bed without using bedrails? 3  Help needed moving from lying on your back to sitting on the side of a flat bed without using bedrails? 3  Help needed moving to and from a bed to a chair (including a wheelchair)? 3  Help needed standing up from a chair using your arms (e.g., wheelchair or bedside chair)? 3  Help needed to walk in hospital room? 3  Help needed climbing 3-5 steps with a railing?  3  6 Click Score 18  Consider Recommendation of Discharge To: Home with [facility]  PT Recommendation  Follow Up Recommendations Home health PT;Supervision/Assistance - 24 hour (return to ALF )  PT equipment Rolling walker with 5" wheels  Individuals Consulted  Consulted and Agree with Results and Recommendations Patient  Acute Rehab PT Goals  Patient Stated Goal get back to walking  PT Goal Formulation With patient  Time For Goal Achievement 07/06/19  Potential to Achieve Goals Good  PT Time Calculation  PT Start Time (ACUTE ONLY) 1207  PT Stop Time (ACUTE ONLY) 1227  PT Time Calculation (min) (ACUTE ONLY) 20 min  PT General Charges  $$ ACUTE PT VISIT 1 Visit  PT Evaluation  $PT Eval Moderate Complexity 1 Mod   Pt presents with the above problem and the above deficits. Pt performs mobility tasks at the min G to min A level. Pt presents with  cognitive difficulties at baseline. Pt demonstrates decreased short term memory and slow processing. At discharge to assisted living facility, Pt will benefit from PT follow up. Pt will benefit from skilled acute physical therapy to promote functional independence and mobility.  Christophe Louis, SPT

## 2019-06-22 NOTE — Progress Notes (Signed)
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  Per [employer] the stimulator is unsafe for MRI, information scanned under media tab.

## 2019-06-22 NOTE — Progress Notes (Signed)
PROGRESS NOTE    Robert D Meddings Jr.  UTM:[id number] DOB: 1946/05/16 DOA: 06/18/2019 PCP: Patient, No Pcp Per   Brief Narrative:  Patient is a 73 year old male with history of depression, chronic alcohol abuse, remote history of concussion who presents from [facility]  independent living with seizures.  He required intubation for airway protection.  Currently extubated and respiratory status is stable.  Hospital course remarkable for altered mental status, agitation.  Neurology was following.  Patient transferred to [facility] service on 06/22/19.  Plan is to do MRI today.  Mental status has improved.   Assessment & Plan:   Active Problems:   Seizure ([place])  Acute hypoxic/hypercarbic respiratory failure: Required intubation for airway protection.  Status post extubation.  Currently respiratory status stable.  There was concern for aspiration pneumonia.  Procalcitonin normal.  He is saturating fine on room air.  Antibiotics discontinued. Sputum culture pending.  Acute metabolic encephalopathy/seizure: Etiology unknown.  Could be associated with alcohol.  Neurology following.  Pending MRI of the brain.  Has been started on Keppra.  Currently he is alert and oriented.    Alcohol abuse: Says he drinks at least 2 glasses of wine a day or more sometimes .Continue thiamine, folic acid.  Monitor for withdrawal.  Counseled for cessation.   Thrombocytopenia: Mild.  Most likely associated with chronic alcohol abuse.  History of hyperlipidemia: Continue Lipitor  Hypothyroidism: Continue Synthyroid  History of depression:Was on Zoloft   Hypokalemia/hypophosphatemia: Currently being supplemented  Debility/deconditioning: PT/OT evaluation requested          DVT prophylaxis: Lovenox Code Status: Full Family Communication: None at the bedside Disposition Plan: Pending work-up.  PT/OT evaluation pending   Consultants: Neurology, PCCM  Procedures: Intubation/extubation  Antimicrobials:   Anti-infectives (From admission, onward)   Start     Dose/Rate Route Frequency Ordered Stop   06/18/19 1700  Ampicillin-Sulbactam (UNASYN) 3 g in sodium chloride 0.9 % 100 mL IVPB  Status:  Discontinued     3 g 200 mL/hr over 30 Minutes Intravenous Every 6 hours 06/18/19 1648 06/20/19 1152   06/18/19 1645  ampicillin-sulbactam (UNASYN) 1.5 g in sodium chloride 0.9 % 100 mL IVPB  Status:  Discontinued     1.5 g 200 mL/hr over 30 Minutes Intravenous Every 8 hours 06/18/19 1643 06/18/19 1648      Subjective:  Patient seen and examined the bedside this morning.  Was taking his breakfast.  Currently hemodynamically stable.  Appears comfortable.  He still was on soft restraints.  Completely alert and oriented.  He agrees for MRI.  Objective: Vitals:   06/21/19 2100 06/22/19 0057 06/22/19 0354 06/22/19 0756  BP: 131/72 130/68 (!) 142/74 (!) 139/91  Pulse: 85 90 86 91  Resp: 17 (!) 22 18 19   Temp: 98.1 F (36.7 C) 99 F (37.2 C) 98 F (36.7 C) 98.8 F (37.1 C)  TempSrc: Oral Oral Oral Oral  SpO2: 94% 94% 95% 97%  Weight:      Height:        Intake/Output Summary (Last 24 hours) at 06/22/2019 0859 Last data filed at 06/21/2019 1700 Gross per 24 hour  Intake 182.21 ml  Output 1400 ml  Net -1217.79 ml   Filed Weights   06/18/19 1408 06/19/19 0405  Weight: 80.2 kg 97.9 kg    Examination:  General exam: Appears calm and comfortable ,Not in distress,average built HEENT:PERRL,Oral mucosa moist, Ear/Nose normal on gross exam Respiratory system: Bilateral equal air entry, normal vesicular breath sounds, no wheezes or  crackles  Cardiovascular system: S1 & S2 heard, RRR. No JVD, murmurs, rubs, gallops or clicks. No pedal edema. Gastrointestinal system: Abdomen is nondistended, soft and nontender. No organomegaly or masses felt. Normal bowel sounds heard. Central nervous system: Alert and oriented. No focal neurological deficits. Extremities: No edema, no clubbing ,no cyanosis, distal  peripheral pulses palpable. Skin: No rashes, lesions or ulcers,no icterus ,no pallor MSK: Normal muscle bulk,tone ,power Psychiatry: Judgement and insight appear normal. Mood & affect appropriate.     Data Reviewed: I have personally reviewed following labs and imaging studies  CBC: Recent Labs  Lab 06/18/19 1425  06/18/19 1741 06/18/19 1935 06/19/19 0307 06/19/19 0628 06/20/19 0901 06/22/19 0048  WBC 7.9  --  11.1*  --   --  7.4 5.8 8.6  NEUTROABS 4.0  --   --   --   --   --   --   --   HGB 15.4   < > 14.5 13.3 12.9* 13.2 13.4 12.8*  HCT 45.2   < > 41.4 39.0 38.0* 38.9* 39.0 37.8*  MCV 97.8  --  95.8  --   --  96.8 98.7 97.9  PLT 196  --  168  --   --  129* 128* 149*   < > = values in this interval not displayed.   Basic Metabolic Panel: Recent Labs  Lab 06/18/19 1425  06/18/19 1544 06/18/19 1741 06/18/19 1935 06/19/19 0307 06/19/19 0628 06/20/19 0901 06/22/19 0048  NA 137   < > 140  --  140 141  --  139 140  K 4.1   < > 4.1  --  3.6 3.4*  --  3.5 3.3*  CL 103  --   --   --   --   --   --  109 108  CO2 18*  --   --   --   --   --   --  19* 21*  GLUCOSE 131*  --   --   --   --   --   --  100* 93  BUN 14  --   --   --   --   --   --  16 14  CREATININE 1.18  --   --   --   --   --   --  1.27* 0.92  CALCIUM 9.0  --   --   --   --   --   --  8.0* 8.4*  MG  --   --   --  2.3  --   --  2.3 2.5* 1.8  PHOS  --   --   --  2.2*  --   --  3.0 3.9 2.2*   < > = values in this interval not displayed.   GFR: Estimated Creatinine Clearance: 83.5 mL/min (by C-G formula based on SCr of 0.92 mg/dL). Liver Function Tests: Recent Labs  Lab 06/18/19 1425  AST 44*  ALT 33  ALKPHOS 74  BILITOT 0.9  PROT 6.8  ALBUMIN 4.2   Recent Labs  Lab 06/18/19 1425  LIPASE 40   No results for input(s): AMMONIA in the last 168 hours. Coagulation Profile: Recent Labs  Lab 06/18/19 1741  INR 1.0   Cardiac Enzymes: No results for input(s): CKTOTAL, CKMB, CKMBINDEX, TROPONINI in the  last 168 hours. BNP (last 3 results) No results for input(s): PROBNP in the last 8760 hours. HbA1C: No results for input(s): HGBA1C in the last 72  hours. CBG: Recent Labs  Lab 06/20/19 2332 06/21/19 0324 06/21/19 0830 06/21/19 1153 06/21/19 1549  GLUCAP 106* 97 123* 103* 94   Lipid Profile: Recent Labs    06/20/19 0345  TRIG 885*   Thyroid Function Tests: No results for input(s): TSH, T4TOTAL, FREET4, T3FREE, THYROIDAB in the last 72 hours. Anemia Panel: No results for input(s): VITAMINB12, FOLATE, FERRITIN, TIBC, IRON, RETICCTPCT in the last 72 hours. Sepsis Labs: Recent Labs  Lab 06/18/19 1741  PROCALCITON <0.10  LATICACIDVEN 2.6*    Recent Results (from the past 240 hour(s))  SARS Coronavirus 2 [facility] order, Performed in [facility] hospital lab) Nasopharyngeal Nasopharyngeal Swab     Status: None   Collection Time: 06/18/19  2:25 PM   Specimen: Nasopharyngeal Swab  Result Value Ref Range Status   SARS Coronavirus 2 NEGATIVE NEGATIVE Final    Comment: (NOTE) If result is NEGATIVE SARS-CoV-2 target nucleic acids are NOT DETECTED. The SARS-CoV-2 RNA is generally detectable in upper and lower  respiratory specimens during the acute phase of infection. The lowest  concentration of SARS-CoV-2 viral copies this assay can detect is 250  copies / mL. A negative result does not preclude SARS-CoV-2 infection  and should not be used as the sole basis for treatment or other  patient management decisions.  A negative result may occur with  improper specimen collection / handling, submission of specimen other  than nasopharyngeal swab, presence of viral mutation(s) within the  areas targeted by this assay, and inadequate number of viral copies  (<250 copies / mL). A negative result must be combined with clinical  observations, patient history, and epidemiological information. If result is POSITIVE SARS-CoV-2 target nucleic acids are DETECTED. The SARS-CoV-2 RNA is  generally detectable in upper and lower  respiratory specimens dur ing the acute phase of infection.  Positive  results are indicative of active infection with SARS-CoV-2.  Clinical  correlation with patient history and other diagnostic information is  necessary to determine patient infection status.  Positive results do  not rule out bacterial infection or co-infection with other viruses. If result is PRESUMPTIVE POSTIVE SARS-CoV-2 nucleic acids MAY BE PRESENT.   A presumptive positive result was obtained on the submitted specimen  and confirmed on repeat testing.  While 2019 novel coronavirus  (SARS-CoV-2) nucleic acids may be present in the submitted sample  additional confirmatory testing may be necessary for epidemiological  and / or clinical management purposes  to differentiate between  SARS-CoV-2 and other Sarbecovirus currently known to infect humans.  If clinically indicated additional testing with an alternate test  methodology [phone]) is advised. The SARS-CoV-2 RNA is generally  detectable in upper and lower respiratory sp ecimens during the acute  phase of infection. The expected result is Negative. Fact Sheet for Patients:  StrictlyIdeas.no Fact Sheet for Healthcare Providers: BankingDealers.co.za This test is not yet approved or cleared by the [place] FDA and has been authorized for detection and/or diagnosis of SARS-CoV-2 by FDA under an Emergency Use Authorization (EUA).  This EUA will remain in effect (meaning this test can be used) for the duration of the COVID-19 declaration under Section 564(b)(1) of the Act, 21 U.S.C. section 360bbb-3(b)(1), unless the authorization is terminated or revoked sooner. Performed at [facility], [place] [address] [place], [place] [address]   MRSA PCR Screening     Status: Abnormal   Collection Time: 06/20/19 10:41 AM   Specimen: Nasal Mucosa; Nasopharyngeal  Result Value  Ref Range Status  MRSA by PCR (A) NEGATIVE Final    INVALID, UNABLE TO DETERMINE THE PRESENCE OF TARGET DUE TO SPECIMEN INTEGRITY. RECOLLECTION REQUESTED.    Comment: RESULT CALLED TO, READ BACK BY AND VERIFIED WITH: RN Vivien Rota [phone] 15 MLM        The GeneXpert MRSA Assay (FDA approved for NASAL specimens only), is one component of a comprehensive MRSA colonization surveillance program. It is not intended to diagnose MRSA infection nor to guide or monitor treatment for MRSA infections. Performed at [facility], [place] [address], [place], [place] [address]   MRSA PCR Screening     Status: None   Collection Time: 06/21/19 10:10 AM  Result Value Ref Range Status   MRSA by PCR NEGATIVE NEGATIVE Final    Comment:        The GeneXpert MRSA Assay (FDA approved for NASAL specimens only), is one component of a comprehensive MRSA colonization surveillance program. It is not intended to diagnose MRSA infection nor to guide or monitor treatment for MRSA infections. Performed at [facility], [place] [address], [place], [place] [address]          Radiology Studies: No results found.      Scheduled Meds:  atorvastatin  20 mg Oral QHS   brimonidine  1 drop Both Eyes BID   enoxaparin (LOVENOX) injection  40 mg Subcutaneous Q24H   famotidine  20 mg Oral BID   levothyroxine  37.5 mcg Oral QAC breakfast   midazolam  1 mg Intravenous Once   montelukast  10 mg Oral QHS   thiamine injection  100 mg Intravenous Q24H   timolol  1 drop Both Eyes BID   Continuous Infusions:  levETIRAcetam 500 mg (06/22/19 0403)     LOS: 4 days    Time spent: 35 mins.More than 50% of that time was spent in counseling and/or coordination of care.      Shelly Coss, MD [facility] Pager [phone]  If 7PM-7AM, please contact night-coverage [email or web address] Password [email or web address] 06/22/2019, 8:59 AM

## 2019-06-23 LAB — BASIC METABOLIC PANEL
Anion gap: 11 (ref 5–15)
BUN: 11 mg/dL (ref 8–23)
CO2: 21 mmol/L — ABNORMAL LOW (ref 22–32)
Calcium: 8.1 mg/dL — ABNORMAL LOW (ref 8.9–10.3)
Chloride: 108 mmol/L (ref 98–111)
Creatinine, Ser: 0.75 mg/dL (ref 0.61–1.24)
GFR calc Af Amer: >60 mL/min (ref >60)
GFR calc non Af Amer: >60 mL/min (ref >60)
Glucose, Bld: 100 mg/dL — ABNORMAL HIGH (ref 70–99)
Potassium: 2.6 mmol/L — CL (ref 3.5–5.1)
Sodium: 140 mmol/L (ref 135–145)

## 2019-06-23 LAB — CBC WITH DIFFERENTIAL/PLATELET
Abs Immature Granulocytes: 0.04 10*3/uL (ref 0.00–0.07)
Basophils Absolute: 0 10*3/uL (ref 0.0–0.1)
Basophils Relative: 1 %
Eosinophils Absolute: 0.2 10*3/uL (ref 0.0–0.5)
Eosinophils Relative: 4 %
HCT: 37.5 % — ABNORMAL LOW (ref 39.0–52.0)
Hemoglobin: 12.7 g/dL — ABNORMAL LOW (ref 13.0–17.0)
Immature Granulocytes: 1 %
Lymphocytes Relative: 21 %
Lymphs Abs: 1.3 10*3/uL (ref 0.7–4.0)
MCH: 32.8 pg (ref 26.0–34.0)
MCHC: 33.9 g/dL (ref 30.0–36.0)
MCV: 96.9 fL (ref 80.0–100.0)
Monocytes Absolute: 0.8 10*3/uL (ref 0.1–1.0)
Monocytes Relative: 13 %
Neutro Abs: 3.7 10*3/uL (ref 1.7–7.7)
Neutrophils Relative %: 60 %
Platelets: 150 10*3/uL (ref 150–400)
RBC: 3.87 MIL/uL — ABNORMAL LOW (ref 4.22–5.81)
RDW: 12.2 % (ref 11.5–15.5)
WBC: 6 10*3/uL (ref 4.0–10.5)
nRBC: 0 % (ref 0.0–0.2)

## 2019-06-23 LAB — PHOSPHORUS: Phosphorus: 3.6 mg/dL (ref 2.5–4.6)

## 2019-06-23 LAB — POTASSIUM: Potassium: 3 mmol/L — ABNORMAL LOW (ref 3.5–5.1)

## 2019-06-23 MED ORDER — POTASSIUM CHLORIDE CRYS ER 20 MEQ PO TBCR: 20.0000 meq | EXTENDED_RELEASE_TABLET | Freq: Every day | ORAL | 0 refills | Status: AC

## 2019-06-23 MED ORDER — LEVETIRACETAM 500 MG PO TABS: 500.0000 mg | ORAL_TABLET | Freq: Two times a day (BID) | ORAL | 1 refills | Status: DC

## 2019-06-23 MED ORDER — POTASSIUM CHLORIDE CRYS ER 20 MEQ PO TBCR
40.0000 meq | EXTENDED_RELEASE_TABLET | ORAL | Status: DC
  Administered 2019-06-23: 40 meq via ORAL
  Filled 2019-06-23: qty 2

## 2019-06-23 MED ORDER — POTASSIUM CHLORIDE CRYS ER 20 MEQ PO TBCR: 20.0000 meq | EXTENDED_RELEASE_TABLET | Freq: Every day | ORAL | 0 refills | Status: DC

## 2019-06-23 MED ORDER — POTASSIUM CHLORIDE CRYS ER 20 MEQ PO TBCR
40.0000 meq | EXTENDED_RELEASE_TABLET | Freq: Once | ORAL | Status: AC
  Administered 2019-06-23: 11:00:00 40 meq via ORAL
  Filled 2019-06-23: qty 2

## 2019-06-23 MED ORDER — LEVETIRACETAM 500 MG PO TABS: 500.0000 mg | ORAL_TABLET | Freq: Two times a day (BID) | ORAL | Status: DC

## 2019-06-23 NOTE — Care Management Important Message (Signed)
Important Message  Patient Details  Name: Robert Cummings. MRN: [id number] Date of Birth: 1946/05/20   Medicare Important Message Given:  Yes     Robert Cummings 06/23/2019, 2:42 PM

## 2019-06-23 NOTE — NC FL2 (Signed)
[place] LEVEL OF CARE SCREENING TOOL     IDENTIFICATION  Patient Name: Robert Cummings. Birthdate: February 01, 1946 Sex: male Admission Date (Current Location): 06/18/2019  [facility] and [place] Number:  Herbalist and Address:  [facility], [place] [address] [place], [place] [address]      Provider Number: [id number]  Attending Physician Name and Address:  Shelly Coss, MD  Relative Name and Phone Number:       Current Level of Care: Hospital Recommended Level of Care: [place] Prior Approval Number:    Date Approved/Denied:   PASRR Number:    Discharge Plan: Other (Comment)(ALF)    Current Diagnoses: Patient Active Problem List   Diagnosis Date Noted  . Seizure ([place]) 06/18/2019    Orientation RESPIRATION BLADDER Height & Weight     Self, Place  Normal Continent Weight: 215 lb 13.3 oz (97.9 kg) Height:  5\' 8"  (172.7 cm)  BEHAVIORAL SYMPTOMS/MOOD NEUROLOGICAL BOWEL NUTRITION STATUS      Continent Diet(regular)  AMBULATORY STATUS COMMUNICATION OF NEEDS Skin   Limited Assist Verbally Normal                       Personal Care Assistance Level of Assistance  Bathing, Feeding, Dressing Bathing Assistance: Limited assistance Feeding assistance: Independent Dressing Assistance: Limited assistance     Functional Limitations Info  Sight, Hearing, Speech Sight Info: Adequate Hearing Info: Impaired(hard of hearing) Speech Info: Adequate    SPECIAL CARE FACTORS FREQUENCY  PT (By licensed PT), OT (By licensed OT)     PT Frequency: 3x/wk with home health OT Frequency: 3x/wk with home health            Contractures Contractures Info: Not present    Additional Factors Info  Code Status, Allergies Code Status Info: Full Allergies Info: Cephalexin, Ciprofloxacin, Codeine, Other           Current Medications (06/23/2019):  This is the current hospital active medication list Current  Facility-Administered Medications  Medication Dose Route Frequency Provider Last Rate Last Dose  . acetaminophen (TYLENOL) tablet 650 mg  650 mg Oral Q6H PRN Kipp Brood, MD      . atorvastatin (LIPITOR) tablet 20 mg  20 mg Oral QHS Kipp Brood, MD   20 mg at 06/22/19 2144  . brimonidine (ALPHAGAN) 0.2 % ophthalmic solution 1 drop  1 drop Both Eyes BID Corey Harold, NP   1 drop at 06/23/19 0835  . enoxaparin (LOVENOX) injection 40 mg  40 mg Subcutaneous Q24H Corey Harold, NP   40 mg at 06/22/19 1705  . famotidine (PEPCID) tablet 20 mg  20 mg Oral BID Kipp Brood, MD   20 mg at 06/23/19 0834  . levETIRAcetam (KEPPRA) IVPB 500 mg/100 mL premix  500 mg Intravenous Q12H Corey Harold, NP 400 mL/hr at 06/23/19 0345 500 mg at 06/23/19 0345  . levothyroxine (SYNTHROID) tablet 37.5 mcg  37.5 mcg Oral QAC breakfast Corey Harold, NP   37.5 mcg at 06/23/19 0542  . montelukast (SINGULAIR) tablet 10 mg  10 mg Oral QHS Kipp Brood, MD   10 mg at 06/22/19 2144  . potassium chloride SA (K-DUR) CR tablet 40 mEq  40 mEq Oral Q4H Shelly Coss, MD   40 mEq at 06/23/19 0834  . thiamine (B-1) injection 100 mg  100 mg Intravenous Q24H Corey Harold, NP   100 mg at 06/22/19 2010  .  timolol (TIMOPTIC) 0.5 % ophthalmic solution 1 drop  1 drop Both Eyes BID Corey Harold, NP   1 drop at 06/23/19 [id number]     Discharge Medications: Please see discharge summary for a list of discharge medications.  Relevant Imaging Results:  Relevant Lab Results:   Additional Information SS#: [id number]  Geralynn Ochs, LCSW

## 2019-06-23 NOTE — TOC Transition Note (Signed)
Transition of Care [facility]) - CM/SW Discharge Note   Patient Details  Name: Robert Cummings. MRN: [id number] Date of Birth: 07/20/1946  Transition of Care [facility]) CM/SW Contact:  Geralynn Ochs, LCSW Phone Number: 06/23/2019, 1:28 PM   Clinical Narrative:   Nurse to call [facility], [phone], around 2:00 when patient is ready for pick up.     Final next level of care: Home w Home Health Services Barriers to Discharge: Barriers Resolved   Patient Goals and CMS Choice        Discharge Placement              Patient chooses bed at: [facility] Patient to be transferred to facility by: Facility transport Name of family member notified: Richard Patient and family notified of of transfer: 06/23/19  Discharge Plan and Services                                     Social Determinants of Health (SDOH) Interventions     Readmission Risk Interventions No flowsheet data found.

## 2019-06-23 NOTE — Discharge Summary (Signed)
Physician Discharge Summary  Robert D Ferreri Jr. BWL:[id number] DOB: [birth year] DOA: 06/18/2019  PCP: Patient, No Pcp Per  Admit date: 06/18/2019 Discharge date: 06/23/2019  Admitted From: Home Disposition:  Home  Discharge Condition:Stable CODE STATUS:FULL Diet recommendation: Heart Healthy  Brief/Interim Summary:   Patient is a 73 year old male with history of depression, chronic alcohol abuse, remote history of concussion,cognitive impairment  who presents from [facility]  independent living with seizures.  He required intubation for airway protection.  Currently extubated and respiratory status is stable.  Hospital course remarkable for altered mental status and  agitation.  Neurology was following.  Patient transferred to [facility] service on 06/22/19.  Mental status has improved and currently at baseline.  He is hemodynamically stable for discharge today.  Following problems were addressed during his hospitalization:  Acute hypoxic/hypercarbic respiratory failure: Required intubation for airway protection.  Status post extubation.  Currently respiratory status stable.  There was concern for aspiration pneumonia.  Procalcitonin normal.  He is saturating fine on room air.  Antibiotics discontinued.  Acute metabolic encephalopathy/seizure/chronic cognitive impairment: History of chronic cognitive impairment with underlying dementia as per the brother.  He has history of concussions about 15 years ago since that he is unable to take care of himself .Neurology was following.  MRI of the brain could not be done due to placement of a stimulator on his chest.  Has been started on Keppra for possible seizures though EEG did not show any clear seizure activity.  Currently he is alert,awake and oriented.  Continue supportive care.  Follow-up with neurology as an outpatient.  Alcohol abuse: Says he drinks at least 2 glasses of wine a day or more sometimes .  Counseled for cessation.   Thrombocytopenia:  Mild.  Most likely associated with chronic alcohol abuse.  History of hyperlipidemia: Continue Lipitor  Hypothyroidism: Continue Synthyroid  History of depression:On Zoloft   Hypokalemia/hypophosphatemia: Supplemented  Debility/deconditioning: PT/OT evaluation requested and recommended home health.   Discharge Diagnoses:  Active Problems:   Seizure [facility])    Discharge Instructions  Discharge Instructions    Ambulatory referral to Neurology   Complete by: As directed    An appointment is requested in approximately: 4 weeks.   Diet - low sodium heart healthy   Complete by: As directed    Discharge instructions   Complete by: As directed    1)Please follow-up with your PCP in a week.  Do a BMP test during the follow-up 2)Take prescribed medications as instructed. 3)Follow up with neurology as an outpatient.  Name and number the provider group has been attached. 4)Donot drive for next 6 months.   Increase activity slowly   Complete by: As directed      Allergies as of 06/23/2019      Reactions   Cephalexin Other (See Comments)   Brother cannot confirm   Ciprofloxacin Other (See Comments)   Brother cannot confirm   Codeine Other (See Comments)   Brother cannot confirm   Other Other (See Comments)   Ragweed- Runny nose, itchy eyes, and congestion also      Medication List    TAKE these medications   atorvastatin 20 MG tablet Commonly known as: LIPITOR Take 20 mg by mouth at bedtime.   Azelastine HCl 0.15 % Soln Place 2 sprays into both nostrils 2 (two) times daily.   brimonidine 0.2 % ophthalmic solution Commonly known as: ALPHAGAN Place 1 drop into both eyes 2 (two) times daily.   cetirizine 10 MG tablet Commonly  known as: ZYRTEC Take 10 mg by mouth daily.   fluticasone 50 MCG/ACT nasal spray Commonly known as: FLONASE Place 2 sprays into both nostrils daily.   levETIRAcetam 500 MG tablet Commonly known as: KEPPRA Take 1 tablet (500 mg total) by  mouth 2 (two) times daily.   loperamide 2 MG tablet Commonly known as: IMODIUM A-D Take 2 mg by mouth 4 (four) times daily as needed for diarrhea or loose stools.   Mapap 325 MG tablet Generic drug: acetaminophen Take 650 mg by mouth daily.   montelukast 10 MG tablet Commonly known as: SINGULAIR Take 10 mg by mouth at bedtime.   multivitamin with minerals Tabs tablet Take 1 tablet by mouth daily.   ondansetron 4 MG tablet Commonly known as: ZOFRAN Take 4 mg by mouth every 8 (eight) hours as needed for nausea or vomiting.   potassium chloride SA 20 MEQ tablet Commonly known as: K-DUR Take 1 tablet (20 mEq total) by mouth daily. Start taking on: June 24, 2019   sertraline 50 MG tablet Commonly known as: ZOLOFT Take 75 mg by mouth every morning.   Synthroid 25 MCG tablet Generic drug: levothyroxine Take 37.5 mcg by mouth daily before breakfast.   timolol 0.5 % ophthalmic solution Commonly known as: TIMOPTIC Place 1 drop into both eyes 2 (two) times daily.   traZODone 50 MG tablet Commonly known as: DESYREL Take 25 mg by mouth at bedtime.   valACYclovir 500 MG tablet Commonly known as: VALTREX Take 500 mg by mouth daily.       Allergies  Allergen Reactions  . Cephalexin Other (See Comments)    Brother cannot confirm  . Ciprofloxacin Other (See Comments)    Brother cannot confirm  . Codeine Other (See Comments)    Brother cannot confirm  . Other Other (See Comments)    Ragweed- Runny nose, itchy eyes, and congestion also    Consultations:  Neurology   Procedures/Studies: Ct Head Wo Contrast  Result Date: 06/18/2019 CLINICAL DATA:  Altered mental status. EXAM: CT HEAD WITHOUT CONTRAST TECHNIQUE: Contiguous axial images were obtained from the base of the skull through the vertex without intravenous contrast. COMPARISON:  None. FINDINGS: Brain: Generalized parenchymal volume loss with commensurate dilatation of the ventricles and sulci. There is no mass,  hemorrhage, edema or other evidence of acute parenchymal abnormality. No extra-axial hemorrhage. Vascular: No hyperdense vessel or unexpected calcification. Skull: No acute findings. Presumed stimulator apparatus overlying the frontal bone. Sinuses/Orbits: Small amount of fluid within the sphenoid sinus. Mild mucosal thickening within the ethmoid air cells. Remainder of the paranasal sinuses are clear. Other: None. IMPRESSION: No acute intracranial abnormality. No intracranial mass, hemorrhage or edema. Electronically Signed   By: Franki Cabot M.D.   On: 06/18/2019 15:17   Dg Chest Port 1 View  Result Date: 06/19/2019 CLINICAL DATA:  Respiratory distress. EXAM: PORTABLE CHEST 1 VIEW COMPARISON:  06/18/2019 FINDINGS: ETT tip is stable above the carina. There is a nasogastric tube in place with tip beyond the level of the expected location of the gastric antrum. Normal heart size. Decreased lung volumes with asymmetric elevation of right hemidiaphragm. Unchanged bibasilar atelectasis. IMPRESSION: 1. Stable exam. 2. Low lung volumes with asymmetric elevation of right hemidiaphragm and bibasilar atelectasis. Electronically Signed   By: Kerby Moors M.D.   On: 06/19/2019 08:50   Dg Chest Portable 1 View  Result Date: 06/18/2019 CLINICAL DATA:  Seizures.  Intubation and enteric tube placement. EXAM: PORTABLE CHEST 1 VIEW COMPARISON:  None. FINDINGS: Endotracheal tube tip 2.4 cm above the carina. Enteric tube entering the stomach with the tip below the field of view. The heart size and mediastinal contours are within normal limits. Normal pulmonary vascularity. Low lung volumes with bibasilar atelectasis. No focal consolidation, pleural effusion, or pneumothorax. No acute osseous abnormality. Left chest wall stimulator noted with leads coursing superiorly into the left neck. IMPRESSION: 1. Appropriately positioned endotracheal tube. 2. Low lung volumes with bibasilar atelectasis. Electronically Signed   By:  Titus Dubin M.D.   On: 06/18/2019 15:10   Dg Abd Portable 1v  Result Date: 06/18/2019 CLINICAL DATA:  Seizures.  Enteric tube placement. EXAM: PORTABLE ABDOMEN - 1 VIEW COMPARISON:  None. FINDINGS: Enteric tube tip in the distal stomach. The bowel gas pattern is normal. No radio-opaque calculi or other significant radiographic abnormality are seen. Bibasilar atelectasis. No acute osseous abnormality. IMPRESSION: 1. Enteric tube tip in the distal stomach. Electronically Signed   By: Titus Dubin M.D.   On: 06/18/2019 15:11      Subjective:  Patient seen and examined the bedside this morning.  Hemodynamically stable for discharge.  Discharge Exam: Vitals:   06/23/19 0410 06/23/19 0937  BP: 126/74 (!) 153/82  Pulse: 90 76  Resp: 17 (!) 24  Temp: 98.7 F (37.1 C) 98.7 F (37.1 C)  SpO2: 96% 95%   Vitals:   06/22/19 1948 06/22/19 2314 06/23/19 0410 06/23/19 0937  BP: (!) 146/94 135/89 126/74 (!) 153/82  Pulse: 84 88 90 76  Resp: 17 17 17  (!) 24  Temp: 98.1 F (36.7 C) 98.2 F (36.8 C) 98.7 F (37.1 C) 98.7 F (37.1 C)  TempSrc: Oral Oral Oral Oral  SpO2: 95% 96% 96% 95%  Weight:      Height:        General: Pt is alert, awake, not in acute distress Cardiovascular: RRR, S1/S2 +, no rubs, no gallops Respiratory: CTA bilaterally, no wheezing, no rhonchi Abdominal: Soft, NT, ND, bowel sounds + Extremities: no edema, no cyanosis    The results of significant diagnostics from this hospitalization (including imaging, microbiology, ancillary and laboratory) are listed below for reference.     Microbiology: Recent Results (from the past 240 hour(s))  SARS Coronavirus 2 [facility] [place] order, Performed in [facility] hospital lab) Nasopharyngeal Nasopharyngeal Swab     Status: None   Collection Time: 06/18/19  2:25 PM   Specimen: Nasopharyngeal Swab  Result Value Ref Range Status   SARS Coronavirus 2 NEGATIVE NEGATIVE Final    Comment: (NOTE) If result is  NEGATIVE SARS-CoV-2 target nucleic acids are NOT DETECTED. The SARS-CoV-2 RNA is generally detectable in upper and lower  respiratory specimens during the acute phase of infection. The lowest  concentration of SARS-CoV-2 viral copies this assay can detect is 250  copies / mL. A negative result does not preclude SARS-CoV-2 infection  and should not be used as the sole basis for treatment or other  patient management decisions.  A negative result may occur with  improper specimen collection / handling, submission of specimen other  than nasopharyngeal swab, presence of viral mutation(s) within the  areas targeted by this assay, and inadequate number of viral copies  (<250 copies / mL). A negative result must be combined with clinical  observations, patient history, and epidemiological information. If result is POSITIVE SARS-CoV-2 target nucleic acids are DETECTED. The SARS-CoV-2 RNA is generally detectable in upper and lower  respiratory specimens dur ing the acute phase of infection.  Positive  results are indicative of active infection with SARS-CoV-2.  Clinical  correlation with patient history and other diagnostic information is  necessary to determine patient infection status.  Positive results do  not rule out bacterial infection or co-infection with other viruses. If result is PRESUMPTIVE POSTIVE SARS-CoV-2 nucleic acids MAY BE PRESENT.   A presumptive positive result was obtained on the submitted specimen  and confirmed on repeat testing.  While 2019 novel coronavirus  (SARS-CoV-2) nucleic acids may be present in the submitted sample  additional confirmatory testing may be necessary for epidemiological  and / or clinical management purposes  to differentiate between  SARS-CoV-2 and other Sarbecovirus currently known to infect humans.  If clinically indicated additional testing with an alternate test  methodology [phone]) is advised. The SARS-CoV-2 RNA is generally  detectable  in upper and lower respiratory sp ecimens during the acute  phase of infection. The expected result is Negative. Fact Sheet for Patients:  StrictlyIdeas.no Fact Sheet for Healthcare Providers: BankingDealers.co.za This test is not yet approved or cleared by the [place] FDA and has been authorized for detection and/or diagnosis of SARS-CoV-2 by FDA under an Emergency Use Authorization (EUA).  This EUA will remain in effect (meaning this test can be used) for the duration of the COVID-19 declaration under Section 564(b)(1) of the Act, 21 U.S.C. section 360bbb-3(b)(1), unless the authorization is terminated or revoked sooner. Performed at [facility], [place] [address] [place], [place] [address]   MRSA PCR Screening     Status: Abnormal   Collection Time: 06/20/19 10:41 AM   Specimen: Nasal Mucosa; Nasopharyngeal  Result Value Ref Range Status   MRSA by PCR (A) NEGATIVE Final    INVALID, UNABLE TO DETERMINE THE PRESENCE OF TARGET DUE TO SPECIMEN INTEGRITY. RECOLLECTION REQUESTED.    Comment: RESULT CALLED TO, READ BACK BY AND VERIFIED WITH: RN Vivien Rota [phone] MLM        The GeneXpert MRSA Assay (FDA approved for NASAL specimens only), is one component of a comprehensive MRSA colonization surveillance program. It is not intended to diagnose MRSA infection nor to guide or monitor treatment for MRSA infections. Performed at [facility], [place] [address], [place], [place] [address]   MRSA PCR Screening     Status: None   Collection Time: 06/21/19 10:10 AM  Result Value Ref Range Status   MRSA by PCR NEGATIVE NEGATIVE Final    Comment:        The GeneXpert MRSA Assay (FDA approved for NASAL specimens only), is one component of a comprehensive MRSA colonization surveillance program. It is not intended to diagnose MRSA infection nor to guide or monitor treatment for MRSA infections. Performed at [facility], [place] [address], [place], [place] [address]      Labs: BNP (last 3 results) No results for input(s): BNP in the last 8760 hours. Basic Metabolic Panel: Recent Labs  Lab 06/18/19 1425  06/18/19 1741 06/18/19 1935 06/19/19 0307 06/19/19 0628 06/20/19 0901 06/22/19 0048 06/23/19 0358  NA 137   < >  --  140 141  --  139 140 140  K 4.1   < >  --  3.6 3.4*  --  3.5 3.3* 2.6*  CL 103  --   --   --   --   --  109 108 108  CO2 18*  --   --   --   --   --  19* 21* 21*  GLUCOSE 131*  --   --   --   --   --  100* 93 100*  BUN 14  --   --   --   --   --  16 14 11   CREATININE 1.18  --   --   --   --   --  1.27* 0.92 0.75  CALCIUM 9.0  --   --   --   --   --  8.0* 8.4* 8.1*  MG  --   --  2.3  --   --  2.3 2.5* 1.8  --   PHOS  --   --  2.2*  --   --  3.0 3.9 2.2* 3.6   < > = values in this interval not displayed.   Liver Function Tests: Recent Labs  Lab 06/18/19 1425  AST 44*  ALT 33  ALKPHOS 74  BILITOT 0.9  PROT 6.8  ALBUMIN 4.2   Recent Labs  Lab 06/18/19 1425  LIPASE 40   No results for input(s): AMMONIA in the last 168 hours. CBC: Recent Labs  Lab 06/18/19 1425  06/18/19 1741  06/19/19 0307 06/19/19 0628 06/20/19 0901 06/22/19 0048 06/23/19 0358  WBC 7.9  --  11.1*  --   --  7.4 5.8 8.6 6.0  NEUTROABS 4.0  --   --   --   --   --   --   --  3.7  HGB 15.4   < > 14.5   < > 12.9* 13.2 13.4 12.8* 12.7*  HCT 45.2   < > 41.4   < > 38.0* 38.9* 39.0 37.8* 37.5*  MCV 97.8  --  95.8  --   --  96.8 98.7 97.9 96.9  PLT 196  --  168  --   --  129* 128* 149* 150   < > = values in this interval not displayed.   Cardiac Enzymes: No results for input(s): CKTOTAL, CKMB, CKMBINDEX, TROPONINI in the last 168 hours. BNP: Invalid input(s): POCBNP CBG: Recent Labs  Lab 06/20/19 2332 06/21/19 0324 06/21/19 0830 06/21/19 1153 06/21/19 1549  GLUCAP 106* 97 123* 103* 94   D-Dimer No results for input(s): DDIMER in the last 72 hours. Hgb A1c No results for input(s):  HGBA1C in the last 72 hours. Lipid Profile No results for input(s): CHOL, HDL, LDLCALC, TRIG, CHOLHDL, LDLDIRECT in the last 72 hours. Thyroid function studies No results for input(s): TSH, T4TOTAL, T3FREE, THYROIDAB in the last 72 hours.  Invalid input(s): FREET3 Anemia work up No results for input(s): VITAMINB12, FOLATE, FERRITIN, TIBC, IRON, RETICCTPCT in the last 72 hours. Urinalysis    Component Value Date/Time   COLORURINE YELLOW 06/18/2019 [place] 06/18/2019 1528   LABSPEC 1.013 06/18/2019 1528   PHURINE 5.0 06/18/2019 1528   GLUCOSEU NEGATIVE 06/18/2019 1528   HGBUR MODERATE (A) 06/18/2019 1528   BILIRUBINUR NEGATIVE 06/18/2019 [place] 06/18/2019 1528   PROTEINUR 100 (A) 06/18/2019 1528   NITRITE NEGATIVE 06/18/2019 1528   LEUKOCYTESUR NEGATIVE 06/18/2019 1528   Sepsis Labs Invalid input(s): PROCALCITONIN,  WBC,  LACTICIDVEN Microbiology Recent Results (from the past 240 hour(s))  SARS Coronavirus 2 [facility] order, Performed in [facility] hospital lab) Nasopharyngeal Nasopharyngeal Swab     Status: None   Collection Time: 06/18/19  2:25 PM   Specimen: Nasopharyngeal Swab  Result Value Ref Range Status   SARS Coronavirus 2 NEGATIVE NEGATIVE Final    Comment: (NOTE) If  result is NEGATIVE SARS-CoV-2 target nucleic acids are NOT DETECTED. The SARS-CoV-2 RNA is generally detectable in upper and lower  respiratory specimens during the acute phase of infection. The lowest  concentration of SARS-CoV-2 viral copies this assay can detect is 250  copies / mL. A negative result does not preclude SARS-CoV-2 infection  and should not be used as the sole basis for treatment or other  patient management decisions.  A negative result may occur with  improper specimen collection / handling, submission of specimen other  than nasopharyngeal swab, presence of viral mutation(s) within the  areas targeted by this assay, and inadequate number of  viral copies  (<250 copies / mL). A negative result must be combined with clinical  observations, patient history, and epidemiological information. If result is POSITIVE SARS-CoV-2 target nucleic acids are DETECTED. The SARS-CoV-2 RNA is generally detectable in upper and lower  respiratory specimens dur ing the acute phase of infection.  Positive  results are indicative of active infection with SARS-CoV-2.  Clinical  correlation with patient history and other diagnostic information is  necessary to determine patient infection status.  Positive results do  not rule out bacterial infection or co-infection with other viruses. If result is PRESUMPTIVE POSTIVE SARS-CoV-2 nucleic acids MAY BE PRESENT.   A presumptive positive result was obtained on the submitted specimen  and confirmed on repeat testing.  While 2019 novel coronavirus  (SARS-CoV-2) nucleic acids may be present in the submitted sample  additional confirmatory testing may be necessary for epidemiological  and / or clinical management purposes  to differentiate between  SARS-CoV-2 and other Sarbecovirus currently known to infect humans.  If clinically indicated additional testing with an alternate test  methodology [phone]) is advised. The SARS-CoV-2 RNA is generally  detectable in upper and lower respiratory sp ecimens during the acute  phase of infection. The expected result is Negative. Fact Sheet for Patients:  StrictlyIdeas.no Fact Sheet for Healthcare Providers: BankingDealers.co.za This test is not yet approved or cleared by the [place] FDA and has been authorized for detection and/or diagnosis of SARS-CoV-2 by FDA under an Emergency Use Authorization (EUA).  This EUA will remain in effect (meaning this test can be used) for the duration of the COVID-19 declaration under Section 564(b)(1) of the Act, 21 U.S.C. section 360bbb-3(b)(1), unless the authorization is  terminated or revoked sooner. Performed at [facility], [place] [address] [place], [place] [address]   MRSA PCR Screening     Status: Abnormal   Collection Time: 06/20/19 10:41 AM   Specimen: Nasal Mucosa; Nasopharyngeal  Result Value Ref Range Status   MRSA by PCR (A) NEGATIVE Final    INVALID, UNABLE TO DETERMINE THE PRESENCE OF TARGET DUE TO SPECIMEN INTEGRITY. RECOLLECTION REQUESTED.    Comment: RESULT CALLED TO, READ BACK BY AND VERIFIED WITH: RN Vivien Rota [phone] MLM        The GeneXpert MRSA Assay (FDA approved for NASAL specimens only), is one component of a comprehensive MRSA colonization surveillance program. It is not intended to diagnose MRSA infection nor to guide or monitor treatment for MRSA infections. Performed at [facility], [place] [address], [place], [place] [address]   MRSA PCR Screening     Status: None   Collection Time: 06/21/19 10:10 AM  Result Value Ref Range Status   MRSA by PCR NEGATIVE NEGATIVE Final    Comment:        The GeneXpert MRSA Assay (FDA approved for  NASAL specimens only), is one component of a comprehensive MRSA colonization surveillance program. It is not intended to diagnose MRSA infection nor to guide or monitor treatment for MRSA infections. Performed at [facility], [place] [address], [place], [place] [address]     Please note: You were cared for by a hospitalist during your hospital stay. Once you are discharged, your primary care physician will handle any further medical issues. Please note that NO REFILLS for any discharge medications will be authorized once you are discharged, as it is imperative that you return to your primary care physician (or establish a relationship with a primary care physician if you do not have one) for your post hospital discharge needs so that they can reassess your need for medications and monitor your lab values.    Time coordinating discharge: 40  minutes  SIGNED:   Shelly Coss, MD  [facility] 06/23/2019, 11:29 AM Pager [phone]  If 7PM-7AM, please contact night-coverage [email or web address] Password [email or web address]

## 2019-06-23 NOTE — Progress Notes (Signed)
Robert Cummings informed of pt's potassium 2.6.

## 2019-08-10 ENCOUNTER — Encounter: Payer: Self-pay | Admitting: Diagnostic Neuroimaging

## 2019-08-10 ENCOUNTER — Ambulatory Visit (INDEPENDENT_AMBULATORY_CARE_PROVIDER_SITE_OTHER): Payer: Medicare Other | Admitting: Diagnostic Neuroimaging

## 2019-08-10 ENCOUNTER — Other Ambulatory Visit: Payer: Self-pay

## 2019-08-10 VITALS — BP 128/75 | HR 59 | Temp 97.5°F | Ht 70.0 in | Wt 205.0 lb

## 2019-08-10 DIAGNOSIS — F0390 Unspecified dementia without behavioral disturbance: Secondary | ICD-10-CM | POA: Diagnosis not present

## 2019-08-10 DIAGNOSIS — G40909 Epilepsy, unspecified, not intractable, without status epilepticus: Secondary | ICD-10-CM | POA: Diagnosis not present

## 2019-08-10 DIAGNOSIS — F03A0 Unspecified dementia, mild, without behavioral disturbance, psychotic disturbance, mood disturbance, and anxiety: Secondary | ICD-10-CM

## 2019-08-10 NOTE — Progress Notes (Signed)
[facility]  PATIENT: Robert Cummings. DOB: [birth year]  REFERRING CLINICIAN: hospitalist HISTORY FROM: patient, brother, niece REASON FOR VISIT: new consult    HISTORICAL  CHIEF COMPLAINT:  Chief Complaint  Patient presents with  . Seizures    rm 7 New Pt, brother- Richard [facility] POA, niece- Di Kindle    HISTORY OF PRESENT ILLNESS:   73 year old male here for evaluation of seizure.    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  Patient's wife passed away from complications of breast cancer in 2018.  Around that time he was also diagnosed with dementia.  Apparently patient's wife was making all major decision making but he was able to do some basic tasks like driving and shopping.  Upon her death, it was apparent that patient was not able to live on his own and therefore moved to [place] to live with family.    Patient's brother is power of attorney and primary Media planner.  Patient initially moved to [facility] and then transition to [facility].  He has been in independent living.  Patient has had some problems with alcohol abuse in the past, but now is down to 1 drink per day.  06/18/2019 patient was at his independent living facility when all of a sudden he fell down, right arm stiffened, had seizure and appeared "gray".  Patient had a second seizure in the emergency room.  Patient was intubated.  He was evaluated and treated for seizure.  CT and EEG were unremarkable.  Patient was started on levetiracetam.  He was discharged back home.  No further seizures.   REVIEW OF SYSTEMS: Full 14 system review of systems performed and negative with exception of: As per HPI.  ALLERGIES: Allergies  Allergen Reactions  . Cephalexin Other (See Comments)    Brother cannot confirm  . Ciprofloxacin Other (See Comments)    Brother cannot confirm  . Codeine Other (See Comments)    Brother cannot confirm  . Other Other  (See Comments)    Ragweed- Runny nose, itchy eyes, and congestion also    HOME MEDICATIONS: Outpatient Medications Prior to Visit  Medication Sig Dispense Refill  . acetaminophen (MAPAP) 325 MG tablet Take 650 mg by mouth daily.    Marland Kitchen atorvastatin (LIPITOR) 20 MG tablet Take 20 mg by mouth at bedtime.    . Azelastine HCl 0.15 % SOLN Place 2 sprays into both nostrils 2 (two) times daily.     . brimonidine (ALPHAGAN) 0.2 % ophthalmic solution Place 1 drop into both eyes 2 (two) times daily.    . cetirizine (ZYRTEC) 10 MG tablet Take 10 mg by mouth daily.    . fluticasone (FLONASE) 50 MCG/ACT nasal spray Place 2 sprays into both nostrils daily.    Marland Kitchen levETIRAcetam (KEPPRA) 500 MG tablet Take 1 tablet (500 mg total) by mouth 2 (two) times daily. 60 tablet 1  . levothyroxine (SYNTHROID) 25 MCG tablet Take 37.5 mcg by mouth daily before breakfast.    . loperamide (IMODIUM A-D) 2 MG tablet Take 2 mg by mouth 4 (four) times daily as needed for diarrhea or loose stools.    . montelukast (SINGULAIR) 10 MG tablet Take 10 mg by mouth at bedtime.    . Multiple Vitamin (MULTIVITAMIN WITH MINERALS) TABS tablet Take 1 tablet by mouth daily.    . ondansetron (ZOFRAN) 4 MG tablet Take 4 mg by mouth every 8 (eight) hours as needed for nausea or vomiting.    Marland Kitchen  potassium chloride SA (K-DUR) 20 MEQ tablet Take 1 tablet (20 mEq total) by mouth daily. Take 40 mg (2 pills) daily  for 5 days then continue one pill daily 30 tablet 0  . sertraline (ZOLOFT) 50 MG tablet Take 75 mg by mouth every morning.    . timolol (TIMOPTIC) 0.5 % ophthalmic solution Place 1 drop into both eyes 2 (two) times daily.    . traZODone (DESYREL) 50 MG tablet Take 25 mg by mouth at bedtime.    . valACYclovir (VALTREX) 500 MG tablet Take 500 mg by mouth daily.     No facility-administered medications prior to visit.     PAST MEDICAL HISTORY: Past Medical History:  Diagnosis Date  . Cancer ([place])    skin, prostate  . Concussion    15-20  yrs ago, due to fall, cognitive dysfunction  . Dementia ([place])   . Depression   . Post-traumatic headache    syndrome  . Seizures ([place])    08/2019 began 15-20 yrs ago, recent 06/18/19    PAST SURGICAL HISTORY: Past Surgical History:  Procedure Laterality Date  . OTHER SURGICAL HISTORY     pain management system implanted, 10-15 years ago  . PROSTATECTOMY      FAMILY HISTORY: Family History  Problem Relation Age of Onset  . Stroke Mother   . Heart disease Father   . Prostate cancer Father   . Blindness Brother     SOCIAL HISTORY: Social History   Socioeconomic History  . Marital status: Widowed    Spouse name: Not on file  . Number of children: 0  . Years of education: Not on file  . Highest education level: Master's degree (e.g., MA, MS, MEng, MEd, MSW, MBA)  Occupational History    Comment: retired Chief Executive Officer, tax degree  Social Needs  . Financial resource strain: Not on file  . Food insecurity    Worry: Not on file    Inability: Not on file  . Transportation needs    Medical: Not on file    Non-medical: Not on file  Tobacco Use  . Smoking status: Former Smoker    Quit date: 08/09/2009    Years since quitting: 10.0  . Smokeless tobacco: Never Used  Substance and Sexual Activity  . Alcohol use: Yes    Alcohol/week: 7.0 standard drinks    Types: 7 Glasses of wine per week    Comment: 08/10/19 "used alot over past yrs, now 1 glass wine now"  . Drug use: Not Currently    Types: Marijuana    Comment: many yrs ago  . Sexual activity: Not on file  Lifestyle  . Physical activity    Days per week: Not on file    Minutes per session: Not on file  . Stress: Not on file  Relationships  . Social Herbalist on phone: Not on file    Gets together: Not on file    Attends religious service: Not on file    Active member of club or organization: Not on file    Attends meetings of clubs or organizations: Not on file    Relationship status: Not on file  .  Intimate partner violence    Fear of current or ex partner: Not on file    Emotionally abused: Not on file    Physically abused: Not on file    Forced sexual activity: Not on file  Other Topics Concern  . Not on file  Social History  Narrative   08/10/19 Lives in [place], [place], alone   Caffeine- coffee, several cups daily     PHYSICAL EXAM  GENERAL EXAM/CONSTITUTIONAL: Vitals:  Vitals:   08/10/19 1058  BP: 128/75  Pulse: (!) 59  Temp: (!) 97.5 F (36.4 C)  Weight: 205 lb (93 kg)  Height: 5\' 10"  (1.778 m)     Body mass index is 29.41 kg/m. Wt Readings from Last 3 Encounters:  08/10/19 205 lb (93 kg)  06/19/19 215 lb 13.3 oz (97.9 kg)     Patient is in no distress; well developed, nourished and groomed; neck is supple  CARDIOVASCULAR:  Examination of carotid arteries is normal; no carotid bruits  Regular rate and rhythm, no murmurs  Examination of peripheral vascular system by observation and palpation is normal  EYES:  Ophthalmoscopic exam of optic discs and posterior segments is normal; no papilledema or hemorrhages  No exam data present  MUSCULOSKELETAL:  Gait, strength, tone, movements noted in Neurologic exam below  NEUROLOGIC: MENTAL STATUS:  No flowsheet data found.  awake, alert, oriented to person  decr memory   normal attention and concentration  language fluent, comprehension intact, naming intact  fund of knowledge appropriate  CRANIAL NERVE:   2nd - no papilledema on fundoscopic exam  2nd, 3rd, 4th, 6th - pupils equal and reactive to light, visual fields full to confrontation, extraocular muscles intact, no nystagmus  5th - facial sensation symmetric  7th - facial strength symmetric  8th - hearing intact  9th - palate elevates symmetrically, uvula midline  11th - shoulder shrug symmetric  12th - tongue protrusion midline  MOTOR:   normal bulk and tone, full strength in the BUE, BLE  SENSORY:   normal and  symmetric to light touch  COORDINATION:   finger-nose-finger, fine finger movements normal  REFLEXES:   deep tendon reflexes TRACE and symmetric  GAIT/STATION:   narrow based gait     DIAGNOSTIC DATA (LABS, IMAGING, TESTING) - I reviewed patient records, labs, notes, testing and imaging myself where available.  Lab Results  Component Value Date   WBC 6.0 06/23/2019   HGB 12.7 (L) 06/23/2019   HCT 37.5 (L) 06/23/2019   MCV 96.9 06/23/2019   PLT 150 06/23/2019      Component Value Date/Time   NA 140 06/23/2019 0358   K 3.0 (L) 06/23/2019 1205   CL 108 06/23/2019 0358   CO2 21 (L) 06/23/2019 0358   GLUCOSE 100 (H) 06/23/2019 0358   BUN 11 06/23/2019 0358   CREATININE 0.75 06/23/2019 0358   CALCIUM 8.1 (L) 06/23/2019 0358   PROT 6.8 06/18/2019 1425   ALBUMIN 4.2 06/18/2019 1425   AST 44 (H) 06/18/2019 1425   ALT 33 06/18/2019 1425   ALKPHOS 74 06/18/2019 1425   BILITOT 0.9 06/18/2019 1425   GFRNONAA >60 06/23/2019 0358   GFRAA >60 06/23/2019 0358   Lab Results  Component Value Date   TRIG 885 (H) 06/20/2019   No results found for: HGBA1C No results found for: VITAMINB12 Lab Results  Component Value Date   TSH 3.129 06/18/2019    06/18/19 CT head  [I reviewed images myself and agree with interpretation. -VRP]  - No acute intracranial abnormality. No intracranial mass, hemorrhage or edema.  06/18/19 EEG  - abnormal EEG secondary to general background slowing.  This finding may be seen with a diffuse disturbance that is etiologically nonspecific, but may include a metabolic encephalopathy, among other possibilities.  There are bursts of beta  activity over the left hemisphere that may very well be artifactual and related to the patient's stimulator.  06/19/19 video EEG - This study is suggestive of mild diffuse encephalopathy. No seizures or epileptiform discharges were seen throughout the recording. - The excessive beta activity seen in the background could be  due to the effect of benzodiazepine.     ASSESSMENT AND PLAN  73 y.o. year old male here with new onset seizure and status epilepticus in August 2020, with history of concussion in 2000, alcohol abuse, and dementia.  Dx:  1. Seizure disorder ([place])   2. Mild dementia ([place])     PLAN:  SEIZURE DISORDER (due to alcohol abuse and dementia; h/o concussion / TBI in 2000) - continue levetiracetam 500mg  twice a day  - safety / supervision issues reviewed; currently at [facility] ind[facility] living - no driving  DEMENTIA (mild-moderate) - consider memantine; family will think about it - request prior neurology records from [place] - supportive care; brain healthy activities reviewed  Return in about 1 year (around 08/09/2020).    Penni Bombard, MD [id number], [id number] AM Certified in Neurology, Neurophysiology and Neuroimaging  [facility] [address], [place], [place] [address] [phone]

## 2019-08-25 ENCOUNTER — Other Ambulatory Visit: Payer: Self-pay

## 2019-08-25 DIAGNOSIS — Z20822 Contact with and (suspected) exposure to covid-19: Secondary | ICD-10-CM

## 2020-05-30 IMAGING — CT CT HEAD WITHOUT CONTRAST
3 series · 15 of 47 positions shown, 18 images · non-contrast
Comparison: None.

CLINICAL DATA: Altered mental status.

EXAM:
CT HEAD WITHOUT CONTRAST
TECHNIQUE: Contiguous axial images were obtained from the base of the skull
through the vertex without intravenous contrast.

[Series 3: head 5.0 h30s · axial · 0.45mm/px · z∈[-150,-25]mm · 9 of 31 slices shown, 12 images]
[im 3/31  brain]
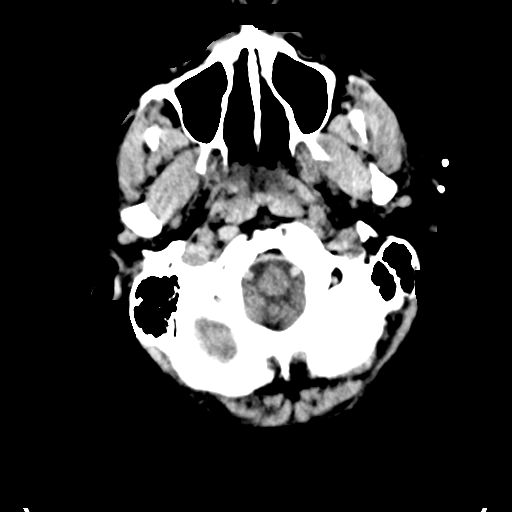
[im 3/31  bone]
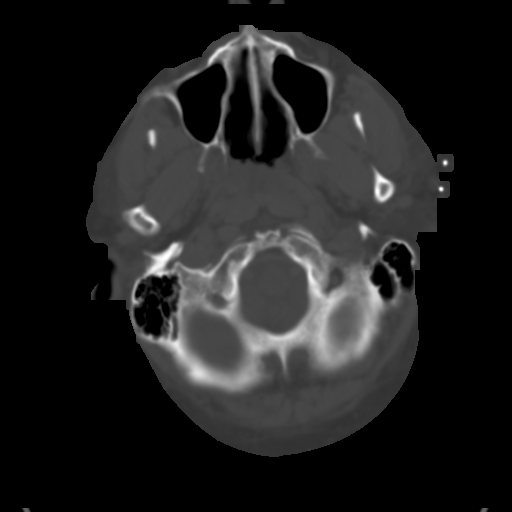
[im 6/31  brain]
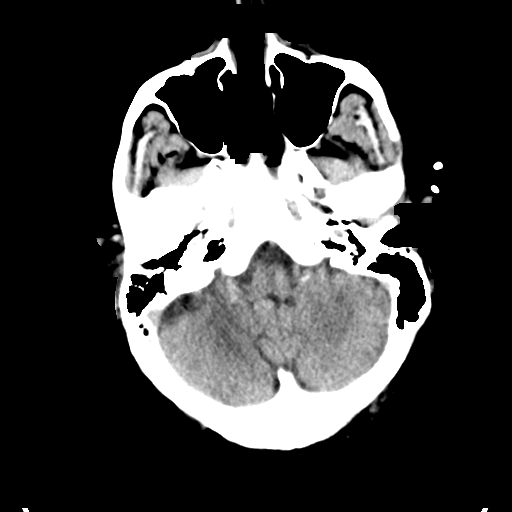
[im 9/31  brain]
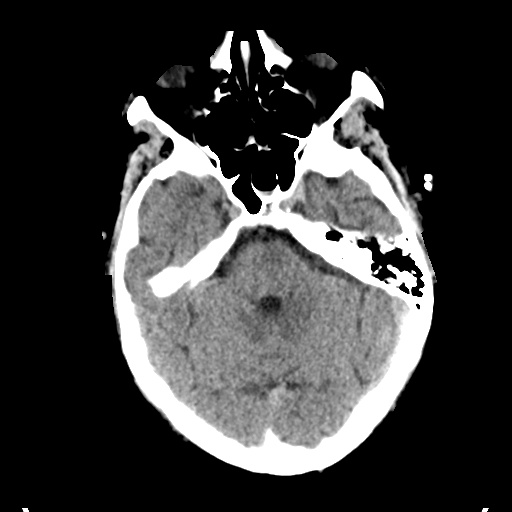
[im 12/31  brain]
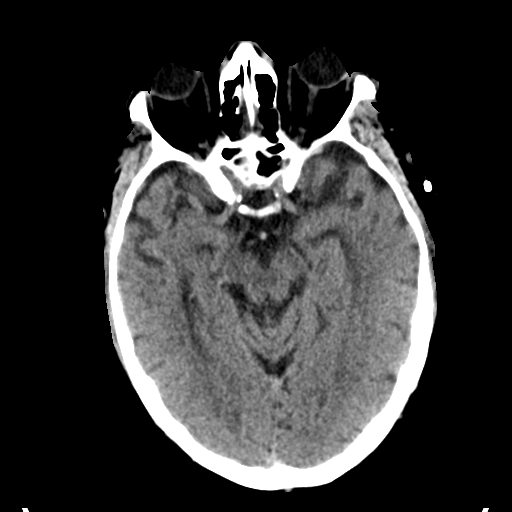
[im 16/31  brain]
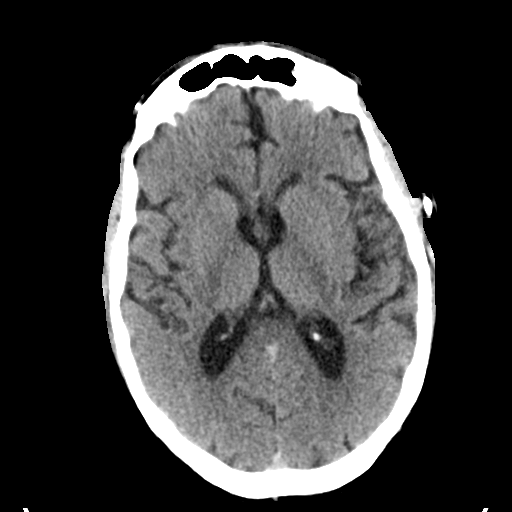
[im 16/31  bone]
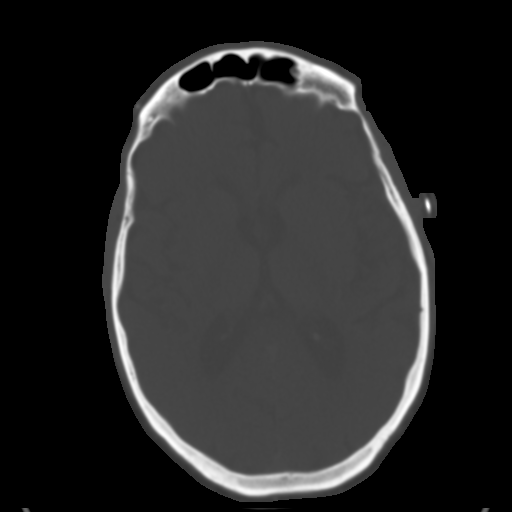
[im 19/31  brain]
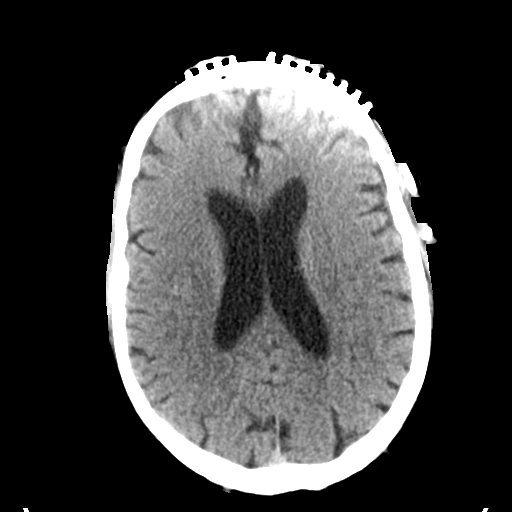
[im 22/31  brain]
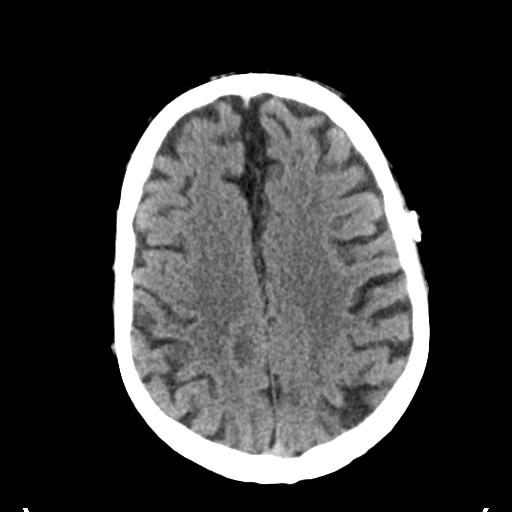
[im 25/31  brain]
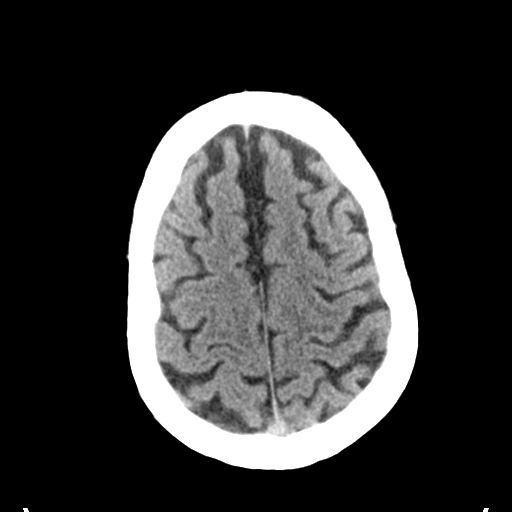
[im 28/31  brain]
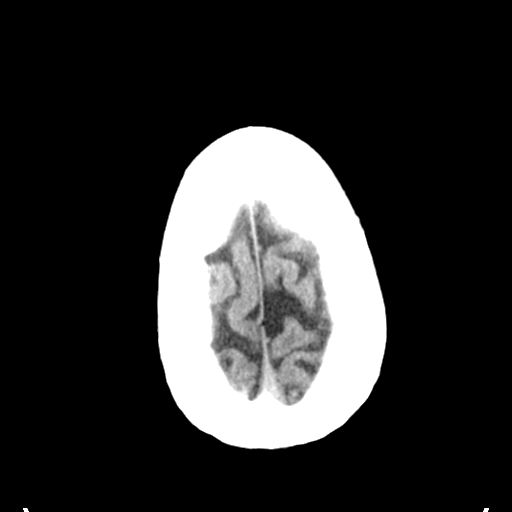
[im 28/31  bone]
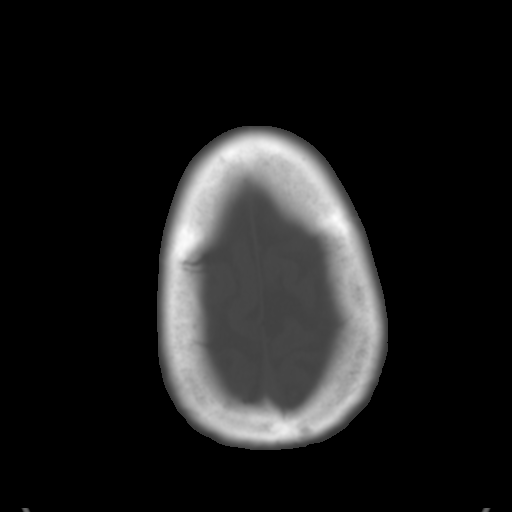

[Series 5: head 3.0 mpr cor · coronal · 0.30mm/px · 3 of 75 slices shown]
[im 25/75  brain]
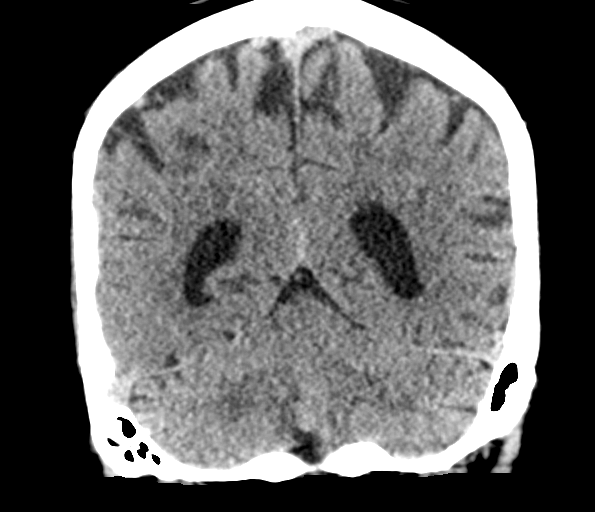
[im 33/75  brain]
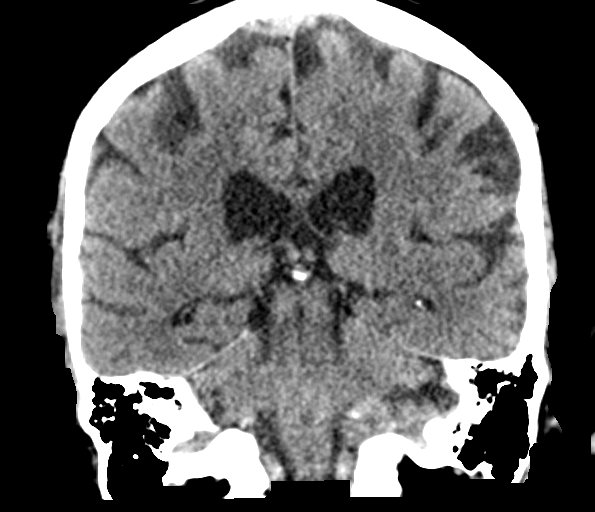
[im 42/75  brain]
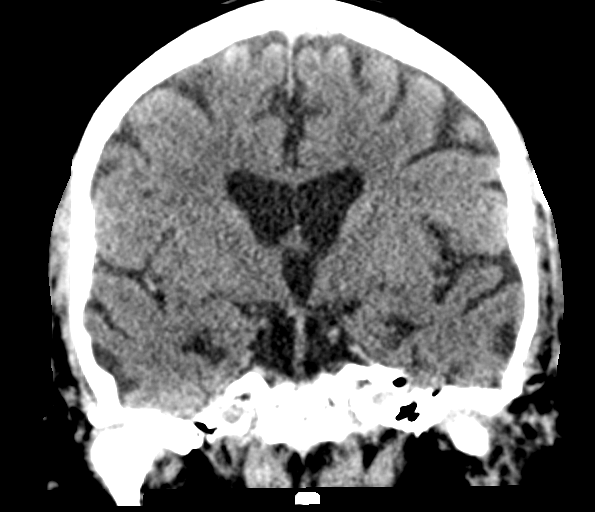

[Series 6: head 3.0 mpr sag · sagittal · 0.30mm/px · 3 of 60 slices shown]
[im 20/60  brain]
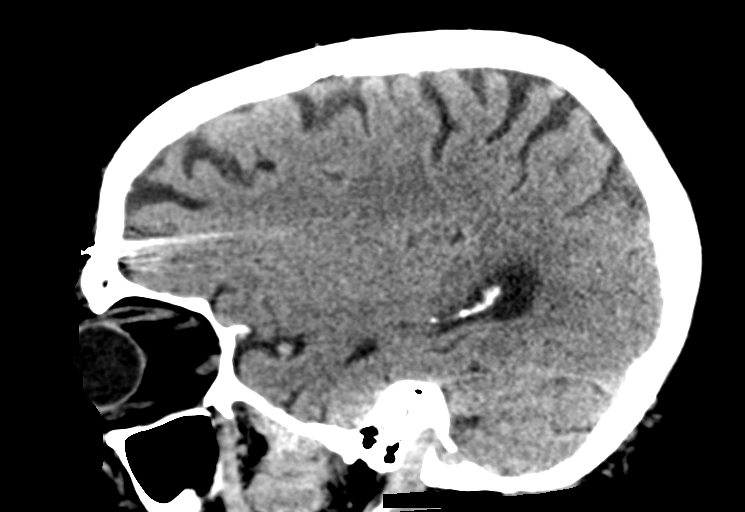
[im 30/60  brain]
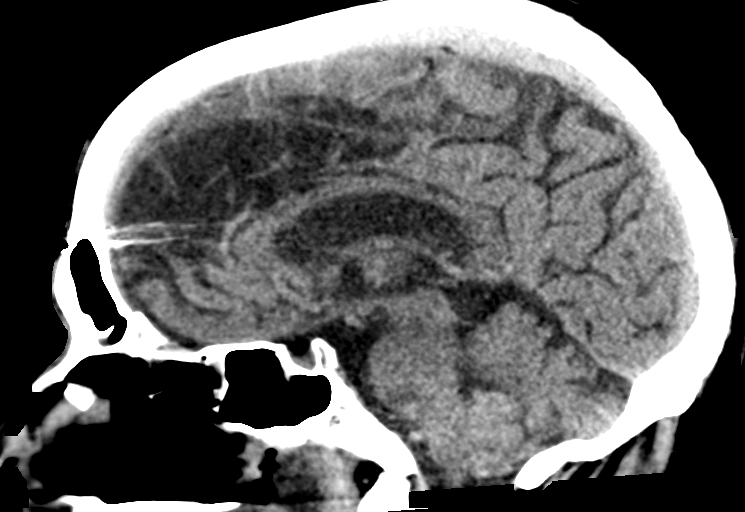
[im 40/60  brain]
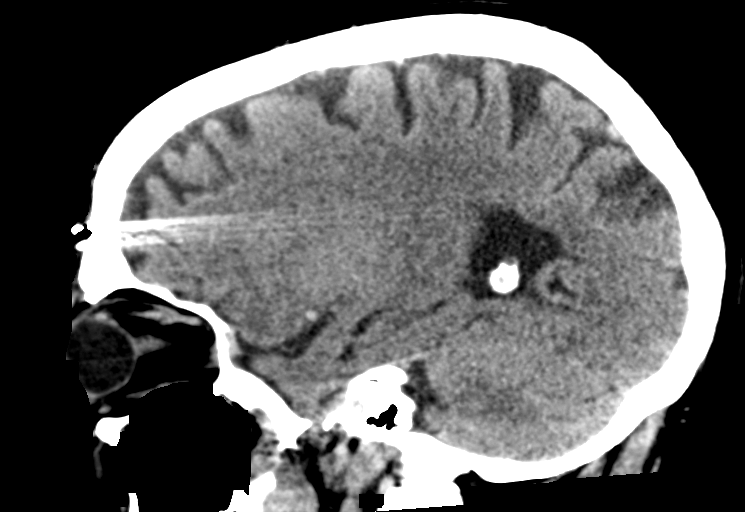

[15 of 47 positions shown; findings below may reference images not displayed]

FINDINGS: Brain: Generalized parenchymal volume loss with commensurate
dilatation of the ventricles and sulci. There is no mass,
hemorrhage, edema or other evidence of acute parenchymal
abnormality. No extra-axial hemorrhage.

Vascular: No hyperdense vessel or unexpected calcification.

Skull: No acute findings. Presumed stimulator apparatus overlying
the frontal bone.

Sinuses/Orbits: Small amount of fluid within the sphenoid sinus.
Mild mucosal thickening within the ethmoid air cells. Remainder of
the paranasal sinuses are clear.

Other: None.
IMPRESSION: No acute intracranial abnormality. No intracranial mass, hemorrhage
or edema.

## 2020-05-31 IMAGING — DX PORTABLE CHEST - 1 VIEW
1 series · 1 of 1 positions shown · non-contrast
Comparison: 06/18/2019

CLINICAL DATA: Respiratory distress.

EXAM:
PORTABLE CHEST 1 VIEW

[chest]
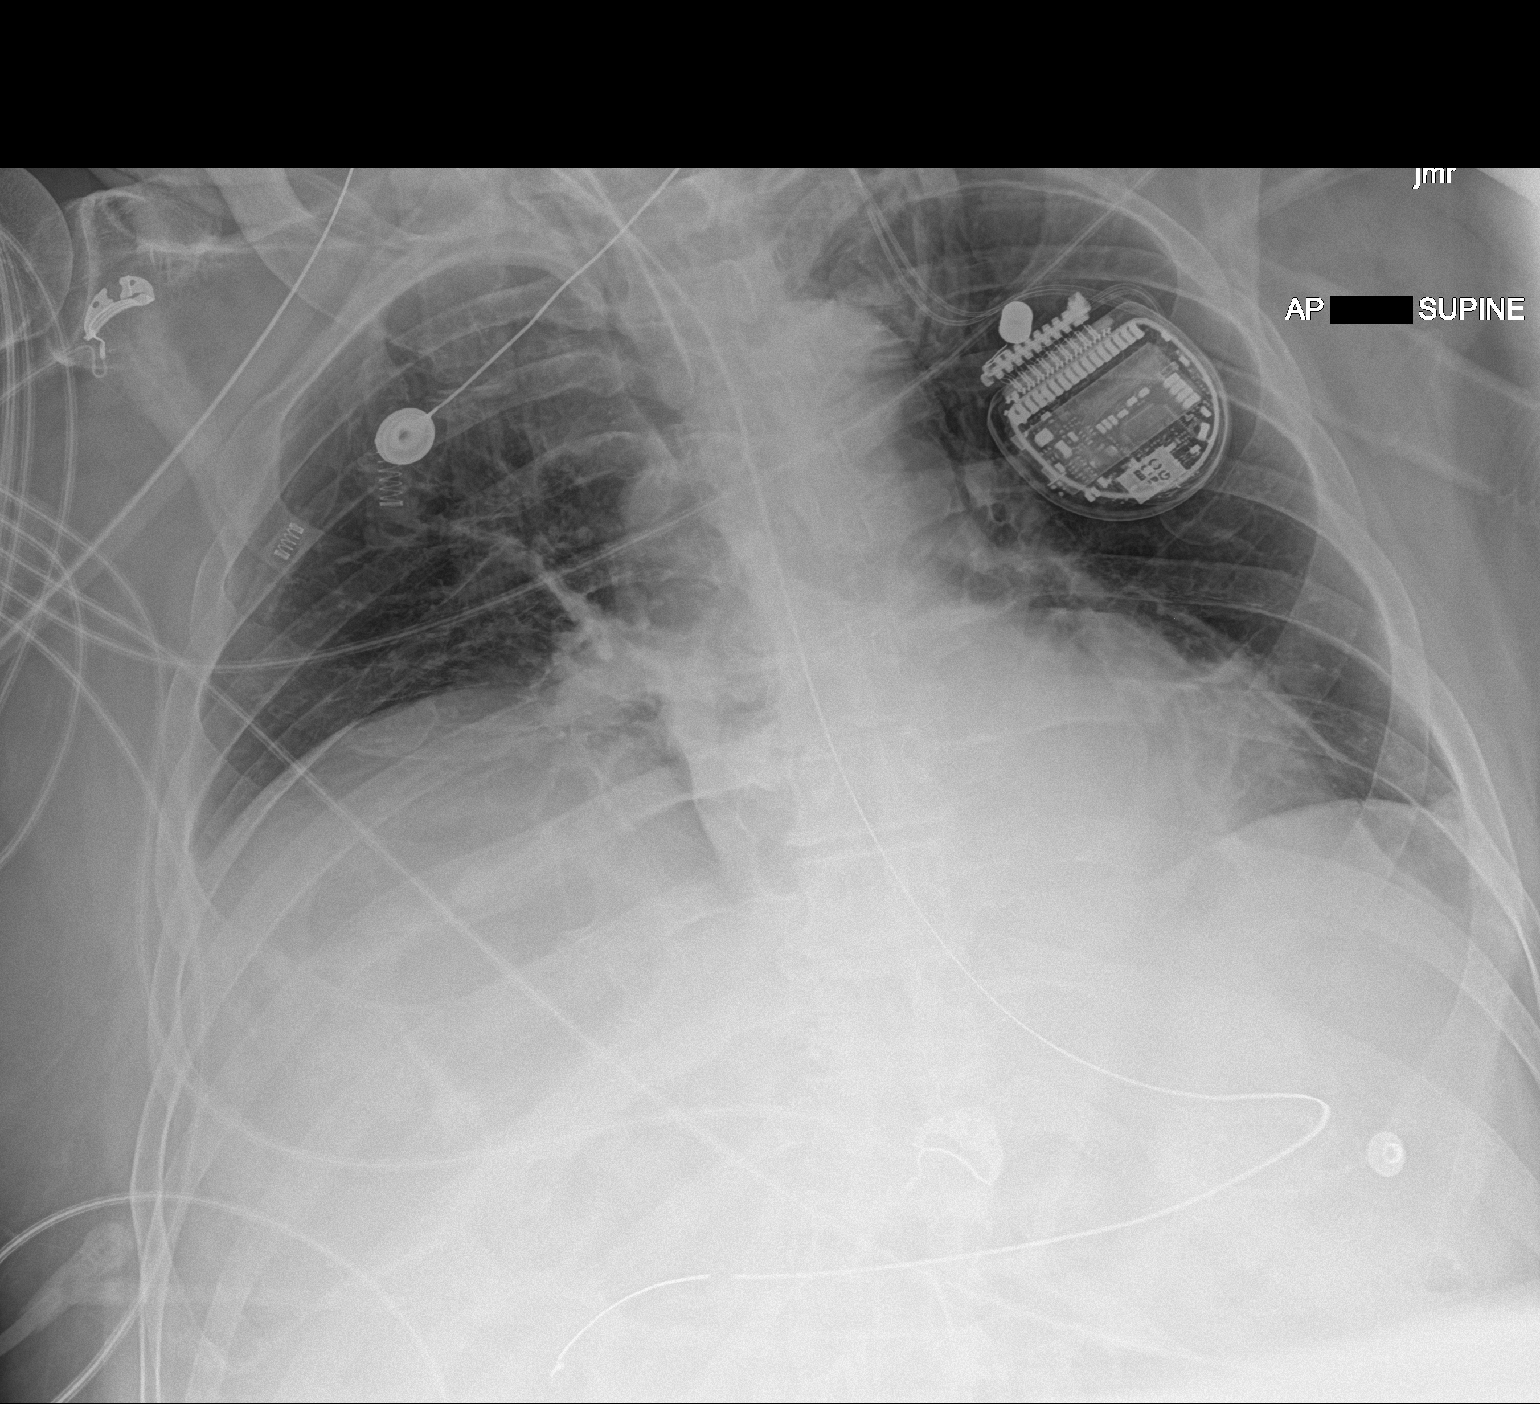

[1 of 1 positions shown; findings below may reference images not displayed]

FINDINGS: ETT tip is stable above the carina. There is a nasogastric tube in
place with tip beyond the level of the expected location of the
gastric antrum. Normal heart size. Decreased lung volumes with
asymmetric elevation of right hemidiaphragm. Unchanged bibasilar
atelectasis.
IMPRESSION: 1. Stable exam.
2. Low lung volumes with asymmetric elevation of right hemidiaphragm
and bibasilar atelectasis.

## 2020-06-04 DIAGNOSIS — Z20828 Contact with and (suspected) exposure to other viral communicable diseases: Secondary | ICD-10-CM | POA: Diagnosis not present

## 2020-06-04 DIAGNOSIS — Z1159 Encounter for screening for other viral diseases: Secondary | ICD-10-CM | POA: Diagnosis not present

## 2020-06-11 DIAGNOSIS — Z1159 Encounter for screening for other viral diseases: Secondary | ICD-10-CM | POA: Diagnosis not present

## 2020-06-11 DIAGNOSIS — Z20828 Contact with and (suspected) exposure to other viral communicable diseases: Secondary | ICD-10-CM | POA: Diagnosis not present

## 2020-06-18 DIAGNOSIS — Z20828 Contact with and (suspected) exposure to other viral communicable diseases: Secondary | ICD-10-CM | POA: Diagnosis not present

## 2020-06-18 DIAGNOSIS — Z1159 Encounter for screening for other viral diseases: Secondary | ICD-10-CM | POA: Diagnosis not present

## 2020-06-21 DIAGNOSIS — Z20828 Contact with and (suspected) exposure to other viral communicable diseases: Secondary | ICD-10-CM | POA: Diagnosis not present

## 2020-06-21 DIAGNOSIS — Z1159 Encounter for screening for other viral diseases: Secondary | ICD-10-CM | POA: Diagnosis not present

## 2020-06-25 DIAGNOSIS — Z1159 Encounter for screening for other viral diseases: Secondary | ICD-10-CM | POA: Diagnosis not present

## 2020-06-25 DIAGNOSIS — Z20828 Contact with and (suspected) exposure to other viral communicable diseases: Secondary | ICD-10-CM | POA: Diagnosis not present

## 2020-06-28 DIAGNOSIS — Z20828 Contact with and (suspected) exposure to other viral communicable diseases: Secondary | ICD-10-CM | POA: Diagnosis not present

## 2020-06-28 DIAGNOSIS — Z1159 Encounter for screening for other viral diseases: Secondary | ICD-10-CM | POA: Diagnosis not present

## 2020-07-02 DIAGNOSIS — Z20828 Contact with and (suspected) exposure to other viral communicable diseases: Secondary | ICD-10-CM | POA: Diagnosis not present

## 2020-07-02 DIAGNOSIS — Z1159 Encounter for screening for other viral diseases: Secondary | ICD-10-CM | POA: Diagnosis not present

## 2020-07-09 DIAGNOSIS — Z20828 Contact with and (suspected) exposure to other viral communicable diseases: Secondary | ICD-10-CM | POA: Diagnosis not present

## 2020-07-09 DIAGNOSIS — Z1159 Encounter for screening for other viral diseases: Secondary | ICD-10-CM | POA: Diagnosis not present

## 2020-07-16 DIAGNOSIS — Z20828 Contact with and (suspected) exposure to other viral communicable diseases: Secondary | ICD-10-CM | POA: Diagnosis not present

## 2020-07-16 DIAGNOSIS — Z1159 Encounter for screening for other viral diseases: Secondary | ICD-10-CM | POA: Diagnosis not present

## 2020-07-19 DIAGNOSIS — Z1159 Encounter for screening for other viral diseases: Secondary | ICD-10-CM | POA: Diagnosis not present

## 2020-07-19 DIAGNOSIS — Z20828 Contact with and (suspected) exposure to other viral communicable diseases: Secondary | ICD-10-CM | POA: Diagnosis not present

## 2020-07-23 DIAGNOSIS — Z20828 Contact with and (suspected) exposure to other viral communicable diseases: Secondary | ICD-10-CM | POA: Diagnosis not present

## 2020-07-23 DIAGNOSIS — Z1159 Encounter for screening for other viral diseases: Secondary | ICD-10-CM | POA: Diagnosis not present

## 2020-07-30 DIAGNOSIS — Z1159 Encounter for screening for other viral diseases: Secondary | ICD-10-CM | POA: Diagnosis not present

## 2020-07-30 DIAGNOSIS — Z20828 Contact with and (suspected) exposure to other viral communicable diseases: Secondary | ICD-10-CM | POA: Diagnosis not present

## 2020-08-02 DIAGNOSIS — Z1159 Encounter for screening for other viral diseases: Secondary | ICD-10-CM | POA: Diagnosis not present

## 2020-08-02 DIAGNOSIS — Z20828 Contact with and (suspected) exposure to other viral communicable diseases: Secondary | ICD-10-CM | POA: Diagnosis not present

## 2020-08-14 ENCOUNTER — Ambulatory Visit: Payer: Medicare Other | Admitting: Diagnostic Neuroimaging

## 2020-10-03 ENCOUNTER — Ambulatory Visit: Payer: Medicare Other | Admitting: Diagnostic Neuroimaging

## 2020-11-12 ENCOUNTER — Other Ambulatory Visit: Payer: Self-pay

## 2020-11-12 ENCOUNTER — Encounter: Payer: Self-pay | Admitting: Diagnostic Neuroimaging

## 2020-11-12 ENCOUNTER — Ambulatory Visit: Payer: Medicare HMO | Admitting: Diagnostic Neuroimaging

## 2020-11-12 VITALS — BP 134/83 | HR 68 | Ht 70.0 in | Wt 214.2 lb

## 2020-11-12 DIAGNOSIS — G40909 Epilepsy, unspecified, not intractable, without status epilepticus: Secondary | ICD-10-CM | POA: Diagnosis not present

## 2020-11-12 MED ORDER — LEVETIRACETAM 500 MG PO TABS: 500.0000 mg | ORAL_TABLET | Freq: Two times a day (BID) | ORAL | 4 refills | Status: DC

## 2020-11-12 NOTE — Progress Notes (Signed)
[facility]  PATIENT: Robert D Takagi Jr. DOB: [birth year]  REFERRING CLINICIAN: hospitalist HISTORY FROM: patient, brother REASON FOR VISIT: follow up  HISTORICAL  CHIEF COMPLAINT:  Chief Complaint  Patient presents with  . Seizures    Rm 7, one year FU, brother- HC POA- Richard "no seizures"    HISTORY OF PRESENT ILLNESS:   UPDATE (11/12/20, VRP): Since last visit, doing well. Symptoms are stable. No further seizures. No alleviating or aggravating factors. Tolerating LEV. Memory issues are stable.  PRIOR HPI :75 year old male here for evaluation of seizure.    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  Patient's wife passed away from complications of breast cancer in 2018.  Around that time he was also diagnosed with dementia.  Apparently patient's wife was making all major decision making but he was able to do some basic tasks like driving and shopping.  Upon her death, it was apparent that patient was not able to live on his own and therefore moved to [place] to live with family.    Patient's brother is power of attorney and primary Media planner.  Patient initially moved to [facility] and then transition to [facility].  He has been in independent living.  Patient has had some problems with alcohol abuse in the past, but now is down to 1 drink per day.  06/18/2019 patient was at his independent living facility when all of a sudden he fell down, right arm stiffened, had seizure and appeared "gray".  Patient had a second seizure in the emergency room.  Patient was intubated.  He was evaluated and treated for seizure.  CT and EEG were unremarkable.  Patient was started on levetiracetam.  He was discharged back home.   REVIEW OF SYSTEMS: Full 14 system review of systems performed and negative with exception of: As per HPI.  ALLERGIES: Allergies  Allergen Reactions  . Cephalexin Other (See Comments)     Brother cannot confirm  . Ciprofloxacin Other (See Comments)    Brother cannot confirm  . Codeine Other (See Comments)    Brother cannot confirm  . Other Other (See Comments)    Ragweed- Runny nose, itchy eyes, and congestion also    HOME MEDICATIONS: Outpatient Medications Prior to Visit  Medication Sig Dispense Refill  . acetaminophen (MAPAP) 325 MG tablet Take 650 mg by mouth daily.    Marland Kitchen atorvastatin (LIPITOR) 20 MG tablet Take 20 mg by mouth at bedtime.    . Azelastine HCl 0.15 % SOLN Place 2 sprays into both nostrils 2 (two) times daily.     . brimonidine (ALPHAGAN) 0.2 % ophthalmic solution Place 1 drop into both eyes 2 (two) times daily.    . cetirizine (ZYRTEC) 10 MG tablet Take 10 mg by mouth daily.    . fluticasone (FLONASE) 50 MCG/ACT nasal spray Place 2 sprays into both nostrils daily.    Marland Kitchen levETIRAcetam (KEPPRA) 500 MG tablet Take 1 tablet (500 mg total) by mouth 2 (two) times daily. 60 tablet 1  . levothyroxine (SYNTHROID) 25 MCG tablet Take 37.5 mcg by mouth daily before breakfast.    . loperamide (IMODIUM A-D) 2 MG tablet Take 2 mg by mouth 4 (four) times daily as needed for diarrhea or loose stools.    . montelukast (SINGULAIR) 10 MG tablet Take 10 mg by mouth at bedtime.    . Multiple Vitamin (MULTIVITAMIN WITH MINERALS) TABS tablet Take 1 tablet by mouth daily.    Marland Kitchen  ondansetron (ZOFRAN) 4 MG tablet Take 4 mg by mouth every 8 (eight) hours as needed for nausea or vomiting.    . potassium chloride SA (K-DUR) 20 MEQ tablet Take 1 tablet (20 mEq total) by mouth daily. Take 40 mg (2 pills) daily  for 5 days then continue one pill daily 30 tablet 0  . sertraline (ZOLOFT) 50 MG tablet Take 75 mg by mouth every morning.    . timolol (TIMOPTIC) 0.5 % ophthalmic solution Place 1 drop into both eyes 2 (two) times daily.    . traZODone (DESYREL) 50 MG tablet Take 25 mg by mouth at bedtime.    . valACYclovir (VALTREX) 500 MG tablet Take 500 mg by mouth daily.     No  facility-administered medications prior to visit.    PAST MEDICAL HISTORY: Past Medical History:  Diagnosis Date  . Cancer ([place])    skin, prostate  . Concussion    15-20 yrs ago, due to fall, cognitive dysfunction  . Dementia ([place])   . Depression   . Post-traumatic headache    syndrome  . Seizures ([place])    08/2019 began 15-20 yrs ago, recent 06/18/19    PAST SURGICAL HISTORY: Past Surgical History:  Procedure Laterality Date  . OTHER SURGICAL HISTORY     pain management system implanted, 10-15 years ago  . PROSTATECTOMY      FAMILY HISTORY: Family History  Problem Relation Age of Onset  . Stroke Mother   . Heart disease Father   . Prostate cancer Father   . Blindness Brother     SOCIAL HISTORY: Social History   Socioeconomic History  . Marital status: Widowed    Spouse name: Not on file  . Number of children: 0  . Years of education: Not on file  . Highest education level: Master's degree (e.g., MA, MS, MEng, MEd, MSW, MBA)  Occupational History    Comment: retired Chief Executive Officer, tax degree  Tobacco Use  . Smoking status: Former Smoker    Quit date: 08/09/2009    Years since quitting: 11.2  . Smokeless tobacco: Never Used  Substance and Sexual Activity  . Alcohol use: Yes    Alcohol/week: 7.0 standard drinks    Types: 7 Glasses of wine per week    Comment: 08/10/19 "used alot over past yrs, now 1 glass wine now"  . Drug use: Not Currently    Types: Marijuana    Comment: many yrs ago  . Sexual activity: Not on file  Other Topics Concern  . Not on file  Social History Narrative   08/10/19 Lives in [place], [place], alone   Caffeine- coffee, several cups daily   Social Determinants of Health   Financial Resource Strain: Not on file  Food Insecurity: Not on file  Transportation Needs: Not on file  Physical Activity: Not on file  Stress: Not on file  Social Connections: Not on file  Intimate Partner Violence: Not on file     PHYSICAL EXAM  GENERAL  EXAM/CONSTITUTIONAL: Vitals:  Vitals:   11/12/20 1303  BP: 134/83  Pulse: 68  Weight: 214 lb 3.2 oz (97.2 kg)  Height: 5\' 10"  (1.778 m)   Body mass index is 30.73 kg/m. Wt Readings from Last 3 Encounters:  11/12/20 214 lb 3.2 oz (97.2 kg)  08/10/19 205 lb (93 kg)  06/19/19 215 lb 13.3 oz (97.9 kg)    Patient is in no distress; well developed, nourished and groomed; neck is supple  CARDIOVASCULAR:  Examination of  carotid arteries is normal; no carotid bruits  Regular rate and rhythm, no murmurs  Examination of peripheral vascular system by observation and palpation is normal  EYES:  Ophthalmoscopic exam of optic discs and posterior segments is normal; no papilledema or hemorrhages No exam data present  MUSCULOSKELETAL:  Gait, strength, tone, movements noted in Neurologic exam below  NEUROLOGIC: MENTAL STATUS:  No flowsheet data found.  awake, alert, oriented to person  decr memory   normal attention and concentration  language fluent, comprehension intact, naming intact  fund of knowledge appropriate  CRANIAL NERVE:   2nd - no papilledema on fundoscopic exam  2nd, 3rd, 4th, 6th - pupils equal and reactive to light, visual fields full to confrontation, extraocular muscles intact, no nystagmus  5th - facial sensation symmetric  7th - facial strength symmetric  8th - hearing intact  9th - palate elevates symmetrically, uvula midline  11th - shoulder shrug symmetric  12th - tongue protrusion midline  MOTOR:   normal bulk and tone, full strength in the BUE, BLE  SENSORY:   normal and symmetric to light touch  COORDINATION:   finger-nose-finger, fine finger movements normal  REFLEXES:   deep tendon reflexes TRACE and symmetric  GAIT/STATION:   narrow based gait     DIAGNOSTIC DATA (LABS, IMAGING, TESTING) - I reviewed patient records, labs, notes, testing and imaging myself where available.  Lab Results  Component Value Date    WBC 6.0 06/23/2019   HGB 12.7 (L) 06/23/2019   HCT 37.5 (L) 06/23/2019   MCV 96.9 06/23/2019   PLT 150 06/23/2019      Component Value Date/Time   NA 140 06/23/2019 0358   K 3.0 (L) 06/23/2019 1205   CL 108 06/23/2019 0358   CO2 21 (L) 06/23/2019 0358   GLUCOSE 100 (H) 06/23/2019 0358   BUN 11 06/23/2019 0358   CREATININE 0.75 06/23/2019 0358   CALCIUM 8.1 (L) 06/23/2019 0358   PROT 6.8 06/18/2019 1425   ALBUMIN 4.2 06/18/2019 1425   AST 44 (H) 06/18/2019 1425   ALT 33 06/18/2019 1425   ALKPHOS 74 06/18/2019 1425   BILITOT 0.9 06/18/2019 1425   GFRNONAA >60 06/23/2019 0358   GFRAA >60 06/23/2019 0358   Lab Results  Component Value Date   TRIG 885 (H) 06/20/2019   No results found for: HGBA1C No results found for: VITAMINB12 Lab Results  Component Value Date   TSH 3.129 06/18/2019    06/18/19 CT head  [I reviewed images myself and agree with interpretation. -VRP]  - No acute intracranial abnormality. No intracranial mass, hemorrhage or edema.  06/18/19 EEG  - abnormal EEG secondary to general background slowing.  This finding may be seen with a diffuse disturbance that is etiologically nonspecific, but may include a metabolic encephalopathy, among other possibilities.  There are bursts of beta activity over the left hemisphere that may very well be artifactual and related to the patient's stimulator.  06/19/19 video EEG - This study is suggestive of mild diffuse encephalopathy. No seizures or epileptiform discharges were seen throughout the recording. - The excessive beta activity seen in the background could be due to the effect of benzodiazepine.     ASSESSMENT AND PLAN  75 y.o. year old male here with new onset seizure and status epilepticus in August 2020, with history of concussion in 2000, alcohol abuse, and dementia.  Dx:  1. Seizure disorder ([place])      PLAN:  SEIZURE DISORDER (due to alcohol abuse and  dementia; h/o concussion / TBI in 2000) -  continue levetiracetam 500mg  twice a day  - safety / supervision issues reviewed; currently at [facility] independent living - no driving  DEMENTIA (mild-moderate) - considered memantine; family will think about it - supportive care; brain healthy activities reviewed  Meds ordered this encounter  Medications  . levETIRAcetam (KEPPRA) 500 MG tablet    Sig: Take 1 tablet (500 mg total) by mouth 2 (two) times daily.    Dispense:  180 tablet    Refill:  4   Return in about 1 year (around 11/12/2021) for with NP (Amy Lomax).    Penni Bombard, MD [id number], 0000000 PM Certified in Neurology, Neurophysiology and Neuroimaging  [facility] [address] [place], [place] [address] [phone]

## 2021-03-04 DIAGNOSIS — Z1159 Encounter for screening for other viral diseases: Secondary | ICD-10-CM | POA: Diagnosis not present

## 2021-03-11 DIAGNOSIS — Z1159 Encounter for screening for other viral diseases: Secondary | ICD-10-CM | POA: Diagnosis not present

## 2021-03-18 DIAGNOSIS — Z1159 Encounter for screening for other viral diseases: Secondary | ICD-10-CM | POA: Diagnosis not present

## 2021-03-25 DIAGNOSIS — Z1159 Encounter for screening for other viral diseases: Secondary | ICD-10-CM | POA: Diagnosis not present

## 2021-04-08 DIAGNOSIS — Z1159 Encounter for screening for other viral diseases: Secondary | ICD-10-CM | POA: Diagnosis not present

## 2021-04-11 DIAGNOSIS — F5101 Primary insomnia: Secondary | ICD-10-CM | POA: Diagnosis not present

## 2021-04-11 DIAGNOSIS — E782 Mixed hyperlipidemia: Secondary | ICD-10-CM | POA: Diagnosis not present

## 2021-04-11 DIAGNOSIS — E039 Hypothyroidism, unspecified: Secondary | ICD-10-CM | POA: Diagnosis not present

## 2021-04-15 DIAGNOSIS — Z1159 Encounter for screening for other viral diseases: Secondary | ICD-10-CM | POA: Diagnosis not present

## 2021-04-22 DIAGNOSIS — Z1159 Encounter for screening for other viral diseases: Secondary | ICD-10-CM | POA: Diagnosis not present

## 2021-04-29 DIAGNOSIS — Z1159 Encounter for screening for other viral diseases: Secondary | ICD-10-CM | POA: Diagnosis not present

## 2021-05-06 DIAGNOSIS — Z1159 Encounter for screening for other viral diseases: Secondary | ICD-10-CM | POA: Diagnosis not present

## 2021-05-06 DIAGNOSIS — Z20828 Contact with and (suspected) exposure to other viral communicable diseases: Secondary | ICD-10-CM | POA: Diagnosis not present

## 2021-05-13 DIAGNOSIS — Z20828 Contact with and (suspected) exposure to other viral communicable diseases: Secondary | ICD-10-CM | POA: Diagnosis not present

## 2021-05-13 DIAGNOSIS — Z1159 Encounter for screening for other viral diseases: Secondary | ICD-10-CM | POA: Diagnosis not present

## 2021-05-20 DIAGNOSIS — Z20828 Contact with and (suspected) exposure to other viral communicable diseases: Secondary | ICD-10-CM | POA: Diagnosis not present

## 2021-05-20 DIAGNOSIS — Z1159 Encounter for screening for other viral diseases: Secondary | ICD-10-CM | POA: Diagnosis not present

## 2021-05-27 DIAGNOSIS — Z20828 Contact with and (suspected) exposure to other viral communicable diseases: Secondary | ICD-10-CM | POA: Diagnosis not present

## 2021-05-27 DIAGNOSIS — Z1159 Encounter for screening for other viral diseases: Secondary | ICD-10-CM | POA: Diagnosis not present

## 2021-11-13 ENCOUNTER — Ambulatory Visit: Payer: Medicare HMO | Admitting: Diagnostic Neuroimaging

## 2021-11-29 ENCOUNTER — Other Ambulatory Visit: Payer: Self-pay | Admitting: Diagnostic Neuroimaging

## 2021-12-23 DIAGNOSIS — Z20822 Contact with and (suspected) exposure to covid-19: Secondary | ICD-10-CM | POA: Diagnosis not present

## 2021-12-30 ENCOUNTER — Ambulatory Visit: Payer: Medicare HMO | Admitting: Diagnostic Neuroimaging

## 2021-12-30 ENCOUNTER — Encounter: Payer: Self-pay | Admitting: Diagnostic Neuroimaging

## 2021-12-30 VITALS — BP 135/83 | HR 73 | Ht 70.0 in | Wt 201.0 lb

## 2021-12-30 DIAGNOSIS — G40909 Epilepsy, unspecified, not intractable, without status epilepticus: Secondary | ICD-10-CM | POA: Diagnosis not present

## 2021-12-30 MED ORDER — LEVETIRACETAM 500 MG PO TABS: 500.0000 mg | ORAL_TABLET | Freq: Two times a day (BID) | ORAL | 4 refills | Status: AC

## 2021-12-30 NOTE — Progress Notes (Signed)
[facility]  PATIENT: Robert D Agosto Jr. DOB: 01-22-1946  REFERRING CLINICIAN: Shelly Coss, MD  HISTORY FROM: patient, brother REASON FOR VISIT: follow up  HISTORICAL  CHIEF COMPLAINT:  Chief Complaint  Patient presents with   Seizures    Rm 6, one year FU, brother-Richard    HISTORY OF PRESENT ILLNESS:   UPDATE (12/30/21, VRP): Since last visit, doing WELL. Symptoms are stable. No seizures. Memory stable. Still at independent living at [facility].   UPDATE (11/12/20, VRP): Since last visit, doing well. Symptoms are stable. No further seizures. No alleviating or aggravating factors. Tolerating LEV. Memory issues are stable.  PRIOR HPI :76 year old male here for evaluation of seizure.    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  Patient's wife passed away from complications of breast cancer in 2018.  Around that time he was also diagnosed with dementia.  Apparently patient's wife was making all major decision making but he was able to do some basic tasks like driving and shopping.  Upon her death, it was apparent that patient was not able to live on his own and therefore moved to [place] to live with family.    Patient's brother is power of attorney and primary Media planner.  Patient initially moved to [facility] and then transition to [facility].  He has been in independent living.  Patient has had some problems with alcohol abuse in the past, but now is down to 1 drink per day.  06/18/2019 patient was at his independent living facility when all of a sudden he fell down, right arm stiffened, had seizure and appeared "gray".  Patient had a second seizure in the emergency room.  Patient was intubated.  He was evaluated and treated for seizure.  CT and EEG were unremarkable.  Patient was started on levetiracetam.  He was discharged back home.   REVIEW OF SYSTEMS: Full 14 system review of systems performed  and negative with exception of: As per HPI.  ALLERGIES: Allergies  Allergen Reactions   Cephalexin Other (See Comments)    Brother cannot confirm   Ciprofloxacin Other (See Comments)    Brother cannot confirm   Codeine Other (See Comments)    Brother cannot confirm   Other Other (See Comments)    Ragweed- Runny nose, itchy eyes, and congestion also    HOME MEDICATIONS: Outpatient Medications Prior to Visit  Medication Sig Dispense Refill   acetaminophen (TYLENOL) 325 MG tablet Take 650 mg by mouth daily.     atorvastatin (LIPITOR) 20 MG tablet Take 20 mg by mouth at bedtime.     Azelastine HCl 0.15 % SOLN Place 2 sprays into both nostrils 2 (two) times daily.      brimonidine (ALPHAGAN) 0.2 % ophthalmic solution Place 1 drop into both eyes 2 (two) times daily.     cetirizine (ZYRTEC) 10 MG tablet Take 10 mg by mouth daily.     fluticasone (FLONASE) 50 MCG/ACT nasal spray Place 2 sprays into both nostrils daily.     levothyroxine (SYNTHROID) 25 MCG tablet Take 37.5 mcg by mouth daily before breakfast.     loperamide (IMODIUM A-D) 2 MG tablet Take 2 mg by mouth 4 (four) times daily as needed for diarrhea or loose stools.     montelukast (SINGULAIR) 10 MG tablet Take 10 mg by mouth at bedtime.     Multiple Vitamin (MULTIVITAMIN WITH MINERALS) TABS tablet Take 1 tablet by mouth daily.  ondansetron (ZOFRAN) 4 MG tablet Take 4 mg by mouth every 8 (eight) hours as needed for nausea or vomiting.     potassium chloride SA (K-DUR) 20 MEQ tablet Take 1 tablet (20 mEq total) by mouth daily. Take 40 mg (2 pills) daily  for 5 days then continue one pill daily 30 tablet 0   sertraline (ZOLOFT) 50 MG tablet Take 75 mg by mouth every morning.     timolol (TIMOPTIC) 0.5 % ophthalmic solution Place 1 drop into both eyes 2 (two) times daily.     traZODone (DESYREL) 50 MG tablet Take 25 mg by mouth at bedtime.     valACYclovir (VALTREX) 500 MG tablet Take 500 mg by mouth daily.     levETIRAcetam  (KEPPRA) 500 MG tablet TAKE ONE TABLET TWICE DAILY 180 tablet 0   No facility-administered medications prior to visit.    PAST MEDICAL HISTORY: Past Medical History:  Diagnosis Date   Cancer ([place])    skin, prostate   Concussion    15-20 yrs ago, due to fall, cognitive dysfunction   Dementia ([place])    Depression    Post-traumatic headache    syndrome   Seizures ([place])    08/2019 began 15-20 yrs ago, recent 06/18/19    PAST SURGICAL HISTORY: Past Surgical History:  Procedure Laterality Date   OTHER SURGICAL HISTORY     pain management system implanted, 10-15 years ago   PROSTATECTOMY      FAMILY HISTORY: Family History  Problem Relation Age of Onset   Stroke Mother    Heart disease Father    Prostate cancer Father    Blindness Brother     SOCIAL HISTORY: Social History   Socioeconomic History   Marital status: Widowed    Spouse name: Not on file   Number of children: 0   Years of education: Not on file   Highest education level: Master's degree (e.g., MA, MS, MEng, MEd, MSW, MBA)  Occupational History    Comment: retired Chief Executive Officer, tax degree  Tobacco Use   Smoking status: Former    Types: Cigarettes    Quit date: 08/09/2009    Years since quitting: 12.4   Smokeless tobacco: Never  Substance and Sexual Activity   Alcohol use: Yes    Alcohol/week: 7.0 standard drinks    Types: 7 Glasses of wine per week    Comment: 08/10/19 "used alot over past yrs, now 1 glass wine now"   Drug use: Not Currently    Types: Marijuana    Comment: many yrs ago   Sexual activity: Not on file  Other Topics Concern   Not on file  Social History Narrative   08/10/19 Lives in [place], [place], alone   Caffeine- coffee, several cups daily   Social Determinants of Health   Financial Resource Strain: Not on file  Food Insecurity: Not on file  Transportation Needs: Not on file  Physical Activity: Not on file  Stress: Not on file  Social Connections: Not on file  Intimate Partner  Violence: Not on file     PHYSICAL EXAM  GENERAL EXAM/CONSTITUTIONAL: Vitals:  Vitals:   12/30/21 1540  BP: 135/83  Pulse: 73  Weight: 201 lb (91.2 kg)  Height: 5\' 10"  (1.778 m)   Body mass index is 28.84 kg/m. Wt Readings from Last 3 Encounters:  12/30/21 201 lb (91.2 kg)  11/12/20 214 lb 3.2 oz (97.2 kg)  08/10/19 205 lb (93 kg)   Patient is in no distress;  well developed, nourished and groomed; neck is supple   MUSCULOSKELETAL: Gait, strength, tone, movements noted in Neurologic exam below  NEUROLOGIC: MENTAL STATUS:  No flowsheet data found. awake, alert, oriented to person decr memory  normal attention and concentration language fluent, comprehension intact, naming intact fund of knowledge appropriate  CRANIAL NERVE:  2nd, 3rd, 4th, 6th - pupils equal and reactive to light, visual fields full to confrontation, extraocular muscles intact, no nystagmus 5th - facial sensation symmetric 7th - facial strength symmetric 8th - hearing intact 9th - palate elevates symmetrically, uvula midline 11th - shoulder shrug symmetric 12th - tongue protrusion midline  MOTOR:  normal bulk and tone, full strength in the BUE, BLE  SENSORY:  normal and symmetric to light touch  COORDINATION:  finger-nose-finger, fine finger movements normal  REFLEXES:  deep tendon reflexes TRACE and symmetric  GAIT/STATION:  narrow based gait     DIAGNOSTIC DATA (LABS, IMAGING, TESTING) - I reviewed patient records, labs, notes, testing and imaging myself where available.  Lab Results  Component Value Date   WBC 6.0 06/23/2019   HGB 12.7 (L) 06/23/2019   HCT 37.5 (L) 06/23/2019   MCV 96.9 06/23/2019   PLT 150 06/23/2019      Component Value Date/Time   NA 140 06/23/2019 0358   K 3.0 (L) 06/23/2019 1205   CL 108 06/23/2019 0358   CO2 21 (L) 06/23/2019 0358   GLUCOSE 100 (H) 06/23/2019 0358   BUN 11 06/23/2019 0358   CREATININE 0.75 06/23/2019 0358   CALCIUM 8.1 (L)  06/23/2019 0358   PROT 6.8 06/18/2019 1425   ALBUMIN 4.2 06/18/2019 1425   AST 44 (H) 06/18/2019 1425   ALT 33 06/18/2019 1425   ALKPHOS 74 06/18/2019 1425   BILITOT 0.9 06/18/2019 1425   GFRNONAA >60 06/23/2019 0358   GFRAA >60 06/23/2019 0358   Lab Results  Component Value Date   TRIG 885 (H) 06/20/2019   No results found for: HGBA1C No results found for: VITAMINB12 Lab Results  Component Value Date   TSH 3.129 06/18/2019    06/18/19 CT head  [I reviewed images myself and agree with interpretation. -VRP]  - No acute intracranial abnormality. No intracranial mass, hemorrhage or edema.  06/18/19 EEG  - abnormal EEG secondary to general background slowing.  This finding may be seen with a diffuse disturbance that is etiologically nonspecific, but may include a metabolic encephalopathy, among other possibilities.  There are bursts of beta activity over the left hemisphere that may very well be artifactual and related to the patient's stimulator.  06/19/19 video EEG - This study is suggestive of mild diffuse encephalopathy. No seizures or epileptiform discharges were seen throughout the recording.  - The excessive beta activity seen in the background could be due to the effect of benzodiazepine.       ASSESSMENT AND PLAN  76 y.o. year old male here with new onset seizure and status epilepticus in August 2020, with history of concussion in 2000, alcohol abuse, and dementia.  Dx:  1. Seizure disorder ([place])      PLAN:  SEIZURE DISORDER (due to alcohol abuse and dementia; h/o concussion / TBI in 2000) - continue levetiracetam 500mg  twice a day  - safety / supervision issues reviewed; currently at [place] independent living - no driving  DEMENTIA (mild-moderate) - supportive care; brain healthy activities reviewed  Meds ordered this encounter  Medications   levETIRAcetam (KEPPRA) 500 MG tablet    Sig: Take 1 tablet (500  mg total) by mouth 2 (two) times daily.     Dispense:  180 tablet    Refill:  4   Return in about 1 year (around 12/30/2022).    Penni Bombard, MD 4/99/6924, 9:32 PM Certified in Neurology, Neurophysiology and Neuroimaging  [facility] [address] [place], [place] [address] [phone]

## 2022-01-06 DIAGNOSIS — Z20822 Contact with and (suspected) exposure to covid-19: Secondary | ICD-10-CM | POA: Diagnosis not present

## 2022-01-15 DIAGNOSIS — I739 Peripheral vascular disease, unspecified: Secondary | ICD-10-CM | POA: Diagnosis not present

## 2022-01-15 DIAGNOSIS — L603 Nail dystrophy: Secondary | ICD-10-CM | POA: Diagnosis not present

## 2022-01-20 DIAGNOSIS — Z20822 Contact with and (suspected) exposure to covid-19: Secondary | ICD-10-CM | POA: Diagnosis not present

## 2022-01-24 DIAGNOSIS — E038 Other specified hypothyroidism: Secondary | ICD-10-CM | POA: Diagnosis not present

## 2022-01-24 DIAGNOSIS — G40802 Other epilepsy, not intractable, without status epilepticus: Secondary | ICD-10-CM | POA: Diagnosis not present

## 2022-01-27 DIAGNOSIS — Z20822 Contact with and (suspected) exposure to covid-19: Secondary | ICD-10-CM | POA: Diagnosis not present

## 2022-01-30 DIAGNOSIS — E782 Mixed hyperlipidemia: Secondary | ICD-10-CM | POA: Diagnosis not present

## 2022-01-30 DIAGNOSIS — I10 Essential (primary) hypertension: Secondary | ICD-10-CM | POA: Diagnosis not present

## 2022-01-30 DIAGNOSIS — E039 Hypothyroidism, unspecified: Secondary | ICD-10-CM | POA: Diagnosis not present

## 2022-01-30 DIAGNOSIS — E119 Type 2 diabetes mellitus without complications: Secondary | ICD-10-CM | POA: Diagnosis not present

## 2022-02-03 DIAGNOSIS — Z20822 Contact with and (suspected) exposure to covid-19: Secondary | ICD-10-CM | POA: Diagnosis not present

## 2022-02-10 DIAGNOSIS — Z20822 Contact with and (suspected) exposure to covid-19: Secondary | ICD-10-CM | POA: Diagnosis not present

## 2022-02-17 DIAGNOSIS — Z20822 Contact with and (suspected) exposure to covid-19: Secondary | ICD-10-CM | POA: Diagnosis not present

## 2022-02-24 DIAGNOSIS — Z20822 Contact with and (suspected) exposure to covid-19: Secondary | ICD-10-CM | POA: Diagnosis not present

## 2022-03-10 DIAGNOSIS — Z20822 Contact with and (suspected) exposure to covid-19: Secondary | ICD-10-CM | POA: Diagnosis not present

## 2022-06-18 ENCOUNTER — Emergency Department (HOSPITAL_COMMUNITY): Payer: Medicare PPO

## 2022-06-18 ENCOUNTER — Encounter (HOSPITAL_COMMUNITY): Payer: Self-pay | Admitting: Emergency Medicine

## 2022-06-18 ENCOUNTER — Inpatient Hospital Stay (HOSPITAL_COMMUNITY)
Admission: EM | Admit: 2022-06-18 | Discharge: 2022-06-26 | DRG: 177 | Disposition: A | Discharge status: Nursing Home (Includes ICF) | Payer: Medicare PPO | Source: Skilled Nursing Facility | Attending: Internal Medicine | Admitting: Internal Medicine

## 2022-06-18 ENCOUNTER — Other Ambulatory Visit: Payer: Self-pay

## 2022-06-18 DIAGNOSIS — E785 Hyperlipidemia, unspecified: Secondary | ICD-10-CM | POA: Diagnosis present

## 2022-06-18 DIAGNOSIS — F03A3 Unspecified dementia, mild, with mood disturbance: Secondary | ICD-10-CM | POA: Diagnosis present

## 2022-06-18 DIAGNOSIS — F05 Delirium due to known physiological condition: Secondary | ICD-10-CM | POA: Diagnosis not present

## 2022-06-18 DIAGNOSIS — R4182 Altered mental status, unspecified: Secondary | ICD-10-CM | POA: Diagnosis present

## 2022-06-18 DIAGNOSIS — G40909 Epilepsy, unspecified, not intractable, without status epilepticus: Secondary | ICD-10-CM

## 2022-06-18 DIAGNOSIS — Z8546 Personal history of malignant neoplasm of prostate: Secondary | ICD-10-CM | POA: Diagnosis not present

## 2022-06-18 DIAGNOSIS — F1010 Alcohol abuse, uncomplicated: Secondary | ICD-10-CM | POA: Diagnosis present

## 2022-06-18 DIAGNOSIS — E039 Hypothyroidism, unspecified: Secondary | ICD-10-CM

## 2022-06-18 DIAGNOSIS — Z85828 Personal history of other malignant neoplasm of skin: Secondary | ICD-10-CM | POA: Diagnosis not present

## 2022-06-18 DIAGNOSIS — Z87891 Personal history of nicotine dependence: Secondary | ICD-10-CM

## 2022-06-18 DIAGNOSIS — F32A Depression, unspecified: Secondary | ICD-10-CM

## 2022-06-18 DIAGNOSIS — Z8042 Family history of malignant neoplasm of prostate: Secondary | ICD-10-CM | POA: Diagnosis not present

## 2022-06-18 DIAGNOSIS — G9341 Metabolic encephalopathy: Secondary | ICD-10-CM

## 2022-06-18 DIAGNOSIS — Z79899 Other long term (current) drug therapy: Secondary | ICD-10-CM | POA: Diagnosis not present

## 2022-06-18 DIAGNOSIS — D696 Thrombocytopenia, unspecified: Secondary | ICD-10-CM

## 2022-06-18 DIAGNOSIS — Z8249 Family history of ischemic heart disease and other diseases of the circulatory system: Secondary | ICD-10-CM

## 2022-06-18 DIAGNOSIS — U071 COVID-19: Principal | ICD-10-CM | POA: Diagnosis present

## 2022-06-18 DIAGNOSIS — G9349 Other encephalopathy: Secondary | ICD-10-CM

## 2022-06-18 DIAGNOSIS — F19939 Other psychoactive substance use, unspecified with withdrawal, unspecified: Secondary | ICD-10-CM | POA: Diagnosis not present

## 2022-06-18 DIAGNOSIS — E872 Acidosis, unspecified: Secondary | ICD-10-CM

## 2022-06-18 DIAGNOSIS — E876 Hypokalemia: Secondary | ICD-10-CM | POA: Diagnosis present

## 2022-06-18 DIAGNOSIS — Z79890 Hormone replacement therapy (postmenopausal): Secondary | ICD-10-CM | POA: Diagnosis not present

## 2022-06-18 DIAGNOSIS — Z823 Family history of stroke: Secondary | ICD-10-CM

## 2022-06-18 DIAGNOSIS — R339 Retention of urine, unspecified: Secondary | ICD-10-CM | POA: Diagnosis present

## 2022-06-18 DIAGNOSIS — I10 Essential (primary) hypertension: Secondary | ICD-10-CM | POA: Diagnosis not present

## 2022-06-18 LAB — COMPREHENSIVE METABOLIC PANEL
ALT: 30 U/L (ref 0–44)
AST: 37 U/L (ref 15–41)
Albumin: 4.3 g/dL (ref 3.5–5.0)
Alkaline Phosphatase: 52 U/L (ref 38–126)
Anion gap: 13 (ref 5–15)
BUN: 11 mg/dL (ref 8–23)
CO2: 19 mmol/L — ABNORMAL LOW (ref 22–32)
Calcium: 8.9 mg/dL (ref 8.9–10.3)
Chloride: 104 mmol/L (ref 98–111)
Creatinine, Ser: 0.96 mg/dL (ref 0.61–1.24)
GFR, Estimated: >60 mL/min (ref >60)
Glucose, Bld: 97 mg/dL (ref 70–99)
Potassium: 3.9 mmol/L (ref 3.5–5.1)
Sodium: 136 mmol/L (ref 135–145)
Total Bilirubin: 1.2 mg/dL (ref 0.3–1.2)
Total Protein: 7.4 g/dL (ref 6.5–8.1)

## 2022-06-18 LAB — FERRITIN: Ferritin: 150 ng/mL (ref 24–336)

## 2022-06-18 LAB — CBC WITH DIFFERENTIAL/PLATELET
Abs Immature Granulocytes: 0.03 10*3/uL (ref 0.00–0.07)
Basophils Absolute: 0 10*3/uL (ref 0.0–0.1)
Basophils Relative: 1 %
Eosinophils Absolute: 0 10*3/uL (ref 0.0–0.5)
Eosinophils Relative: 0 %
HCT: 41.2 % (ref 39.0–52.0)
Hemoglobin: 14.5 g/dL (ref 13.0–17.0)
Immature Granulocytes: 1 %
Lymphocytes Relative: 29 %
Lymphs Abs: 1.2 10*3/uL (ref 0.7–4.0)
MCH: 32.2 pg (ref 26.0–34.0)
MCHC: 35.2 g/dL (ref 30.0–36.0)
MCV: 91.4 fL (ref 80.0–100.0)
Monocytes Absolute: 0.9 10*3/uL (ref 0.1–1.0)
Monocytes Relative: 21 %
Neutro Abs: 2.1 10*3/uL (ref 1.7–7.7)
Neutrophils Relative %: 48 %
Platelets: 133 10*3/uL — ABNORMAL LOW (ref 150–400)
RBC: 4.51 MIL/uL (ref 4.22–5.81)
RDW: 11.4 % — ABNORMAL LOW (ref 11.5–15.5)
WBC: 4.3 10*3/uL (ref 4.0–10.5)
nRBC: 0 % (ref 0.0–0.2)

## 2022-06-18 LAB — VITAMIN B12: Vitamin B-12: 462 pg/mL (ref 180–914)

## 2022-06-18 LAB — RESP PANEL BY RT-PCR (FLU A&B, COVID) ARPGX2
Influenza A by PCR: NEGATIVE
Influenza B by PCR: NEGATIVE
SARS Coronavirus 2 by RT PCR: POSITIVE — AB

## 2022-06-18 LAB — AMMONIA: Ammonia: 21 umol/L (ref 9–35)

## 2022-06-18 LAB — TSH: TSH: 3.654 u[IU]/mL (ref 0.350–4.500)

## 2022-06-18 LAB — D-DIMER, QUANTITATIVE: D-Dimer, Quant: 0.52 ug/mL-FEU — ABNORMAL HIGH (ref 0.00–0.50)

## 2022-06-18 LAB — FIBRINOGEN: Fibrinogen: 523 mg/dL — ABNORMAL HIGH (ref 210–475)

## 2022-06-18 LAB — CBG MONITORING, ED: Glucose-Capillary: 101 mg/dL — ABNORMAL HIGH (ref 70–99)

## 2022-06-18 LAB — ETHANOL: Alcohol, Ethyl (B): <10 mg/dL (ref <10)

## 2022-06-18 LAB — LACTATE DEHYDROGENASE: LDH: 188 U/L (ref 98–192)

## 2022-06-18 MED ORDER — SODIUM CHLORIDE 0.9 % IV SOLN: INTRAVENOUS | Status: AC

## 2022-06-18 MED ORDER — NIRMATRELVIR/RITONAVIR (PAXLOVID)TABLET
3.0000 | ORAL_TABLET | Freq: Two times a day (BID) | ORAL | Status: AC
  Administered 2022-06-18 – 2022-06-23 (×10): 3 via ORAL
  Filled 2022-06-18: qty 30

## 2022-06-18 MED ORDER — THIAMINE HCL 100 MG/ML IJ SOLN
100.0000 mg | Freq: Every day | INTRAMUSCULAR | Status: DC
  Filled 2022-06-18 (×3): qty 2

## 2022-06-18 MED ORDER — ENOXAPARIN SODIUM 40 MG/0.4ML IJ SOSY
40.0000 mg | PREFILLED_SYRINGE | Freq: Every day | INTRAMUSCULAR | Status: DC
  Administered 2022-06-18 – 2022-06-25 (×8): 40 mg via SUBCUTANEOUS
  Filled 2022-06-18 (×8): qty 0.4

## 2022-06-18 MED ORDER — ACETAMINOPHEN 325 MG PO TABS
650.0000 mg | ORAL_TABLET | Freq: Four times a day (QID) | ORAL | Status: DC | PRN
  Administered 2022-06-22 – 2022-06-23 (×2): 650 mg via ORAL
  Filled 2022-06-18 (×2): qty 2

## 2022-06-18 MED ORDER — LORAZEPAM 2 MG/ML IJ SOLN
1.0000 mg | INTRAMUSCULAR | Status: DC | PRN
  Administered 2022-06-18 – 2022-06-20 (×4): 2 mg via INTRAVENOUS
  Filled 2022-06-18 (×4): qty 1

## 2022-06-18 MED ORDER — ADULT MULTIVITAMIN W/MINERALS CH
1.0000 | ORAL_TABLET | Freq: Every day | ORAL | Status: DC
  Administered 2022-06-18 – 2022-06-26 (×9): 1 via ORAL
  Filled 2022-06-18 (×9): qty 1

## 2022-06-18 MED ORDER — LORAZEPAM 1 MG PO TABS
1.0000 mg | ORAL_TABLET | ORAL | Status: DC | PRN
  Administered 2022-06-19: 2 mg via ORAL
  Administered 2022-06-19 (×2): 1 mg via ORAL
  Filled 2022-06-18: qty 1
  Filled 2022-06-18: qty 2
  Filled 2022-06-18: qty 1

## 2022-06-18 MED ORDER — THIAMINE HCL 100 MG PO TABS
100.0000 mg | ORAL_TABLET | Freq: Every day | ORAL | Status: DC
  Administered 2022-06-18 – 2022-06-26 (×9): 100 mg via ORAL
  Filled 2022-06-18 (×9): qty 1

## 2022-06-18 MED ORDER — FOLIC ACID 1 MG PO TABS
1.0000 mg | ORAL_TABLET | Freq: Every day | ORAL | Status: DC
  Administered 2022-06-18 – 2022-06-26 (×9): 1 mg via ORAL
  Filled 2022-06-18 (×9): qty 1

## 2022-06-18 NOTE — ED Triage Notes (Signed)
Pt arriving from [place] for AMS, but has hx of dementia. Pt tested positive for Covid 2 days ago and has since had episodes of incontinence. Staff reports concern that pt may have potential ETOH withdrawal because he drinks daily, but has not had alcohol in the last couple days.

## 2022-06-18 NOTE — ED Notes (Signed)
Pt requiring repeat redirection back to room.

## 2022-06-18 NOTE — ED Notes (Signed)
ED TO INPATIENT HANDOFF REPORT  Name/Age/Gender Robert Cummings. 76 y.o. male  Code Status Code Status History     Date Active Date Inactive Code Status Order ID Comments User Context   06/18/2019 1637 06/23/2019 1711 Full Code [id number]  Garner Nash, DO ED       Home/SNF/Other Nursing Home  Chief Complaint COVID-19 virus infection [U07.1]  Level of Care/Admitting Diagnosis ED Disposition     ED Disposition  Admit   Condition  --   [facility]: [facility] [[id number]]  Level of Care: Telemetry [5]  Admit to tele based on following criteria: Complex arrhythmia (Bradycardia/Tachycardia)  May place patient in observation at [facility] or [place] if equivalent level of care is available:: Yes  Covid Evaluation: Confirmed COVID Positive  Diagnosis: COVID-19 virus infection [[id number]]  Admitting Physician: Shela Leff [[id number]]  Attending Physician: Shela Leff [[id number]]          Medical History Past Medical History:  Diagnosis Date   Cancer ([place])    skin, prostate   Concussion    15-20 yrs ago, due to fall, cognitive dysfunction   Dementia ([place])    Depression    Post-traumatic headache    syndrome   Seizures ([place])    08/2019 began 15-20 yrs ago, recent 06/18/19    Allergies Allergies  Allergen Reactions   Cephalexin Other (See Comments)    Brother cannot confirm   Ciprofloxacin Other (See Comments)    Brother cannot confirm   Codeine Other (See Comments)    Brother cannot confirm   Other Other (See Comments)    Ragweed- Runny nose, itchy eyes, and congestion also    IV Location/Drains/Wounds Patient Lines/Drains/Airways Status     Active Line/Drains/Airways     Name Placement date Placement time Site Days   Peripheral IV 06/18/22 20 G Anterior;Right Forearm 06/18/22  1529  Forearm  less than 1            Labs/Imaging Results for orders placed or performed during the hospital  encounter of 06/18/22 (from the past 17 hour(s))  CBG monitoring, ED     Status: Abnormal   Collection Time: 06/18/22  3:17 PM  Result Value Ref Range   Glucose-Capillary 101 (H) 70 - 99 mg/dL    Comment: Glucose reference range applies only to samples taken after fasting for at least 8 hours.  Resp Panel by RT-PCR (Flu A&B, Covid) Anterior Nasal Swab     Status: Abnormal   Collection Time: 06/18/22  4:04 PM   Specimen: Anterior Nasal Swab  Result Value Ref Range   SARS Coronavirus 2 by RT PCR POSITIVE (A) NEGATIVE    Comment: (NOTE) SARS-CoV-2 target nucleic acids are DETECTED.  The SARS-CoV-2 RNA is generally detectable in upper respiratory specimens during the acute phase of infection. Positive results are indicative of the presence of the identified virus, but do not rule out bacterial infection or co-infection with other pathogens not detected by the test. Clinical correlation with patient history and other diagnostic information is necessary to determine patient infection status. The expected result is Negative.  Fact Sheet for Patients: EntrepreneurPulse.com.au  Fact Sheet for Healthcare Providers: IncredibleEmployment.be  This test is not yet approved or cleared by the [place] FDA and  has been authorized for detection and/or diagnosis of SARS-CoV-2 by FDA under an Emergency Use Authorization (EUA).  This EUA will remain in effect (meaning this test can be used) for the duration  of  the COVID-19 declaration under Section 564(b)(1) of the A ct, 21 U.S.C. section 360bbb-3(b)(1), unless the authorization is terminated or revoked sooner.     Influenza A by PCR NEGATIVE NEGATIVE   Influenza B by PCR NEGATIVE NEGATIVE    Comment: (NOTE) The Xpert Xpress SARS-CoV-2/FLU/RSV plus assay is intended as an aid in the diagnosis of influenza from Nasopharyngeal swab specimens and should not be used as a sole basis for treatment. Nasal  washings and aspirates are unacceptable for Xpert Xpress SARS-CoV-2/FLU/RSV testing.  Fact Sheet for Patients: EntrepreneurPulse.com.au  Fact Sheet for Healthcare Providers: IncredibleEmployment.be  This test is not yet approved or cleared by the [place] FDA and has been authorized for detection and/or diagnosis of SARS-CoV-2 by FDA under an Emergency Use Authorization (EUA). This EUA will remain in effect (meaning this test can be used) for the duration of the COVID-19 declaration under Section 564(b)(1) of the Act, 21 U.S.C. section 360bbb-3(b)(1), unless the authorization is terminated or revoked.  Performed at [facility], [place] [address], [place], [place] [address]   CBC with Differential     Status: Abnormal   Collection Time: 06/18/22  4:04 PM  Result Value Ref Range   WBC 4.3 4.0 - 10.5 K/uL   RBC 4.51 4.22 - 5.81 MIL/uL   Hemoglobin 14.5 13.0 - 17.0 g/dL   HCT 41.2 39.0 - 52.0 %   MCV 91.4 80.0 - 100.0 fL   MCH 32.2 26.0 - 34.0 pg   MCHC 35.2 30.0 - 36.0 g/dL   RDW 11.4 (L) 11.5 - 15.5 %   Platelets 133 (L) 150 - 400 K/uL   nRBC 0.0 0.0 - 0.2 %   Neutrophils Relative % 48 %   Neutro Abs 2.1 1.7 - 7.7 K/uL   Lymphocytes Relative 29 %   Lymphs Abs 1.2 0.7 - 4.0 K/uL   Monocytes Relative 21 %   Monocytes Absolute 0.9 0.1 - 1.0 K/uL   Eosinophils Relative 0 %   Eosinophils Absolute 0.0 0.0 - 0.5 K/uL   Basophils Relative 1 %   Basophils Absolute 0.0 0.0 - 0.1 K/uL   Immature Granulocytes 1 %   Abs Immature Granulocytes 0.03 0.00 - 0.07 K/uL    Comment: Performed at [facility], [place] [address], [place], [place] [address]  Comprehensive metabolic panel     Status: Abnormal   Collection Time: 06/18/22  4:04 PM  Result Value Ref Range   Sodium 136 135 - 145 mmol/L   Potassium 3.9 3.5 - 5.1 mmol/L   Chloride 104 98 - 111 mmol/L   CO2 19 (L) 22 - 32 mmol/L   Glucose, Bld 97 70 - 99  mg/dL    Comment: Glucose reference range applies only to samples taken after fasting for at least 8 hours.   BUN 11 8 - 23 mg/dL   Creatinine, Ser 0.96 0.61 - 1.24 mg/dL   Calcium 8.9 8.9 - 10.3 mg/dL   Total Protein 7.4 6.5 - 8.1 g/dL   Albumin 4.3 3.5 - 5.0 g/dL   AST 37 15 - 41 U/L   ALT 30 0 - 44 U/L   Alkaline Phosphatase 52 38 - 126 U/L   Total Bilirubin 1.2 0.3 - 1.2 mg/dL   GFR, Estimated >60 >60 mL/min    Comment: (NOTE) Calculated using the CKD-EPI Creatinine Equation (2021)    Anion gap 13 5 - 15    Comment: Performed at [facility], [address] [place], [place] [address]  Ammonia     Status: None   Collection Time: 06/18/22  4:04 PM  Result Value Ref Range   Ammonia 21 9 - 35 umol/L    Comment: Performed at [facility], [place] [address] [place], [place] [address]  Ethanol     Status: None   Collection Time: 06/18/22  4:04 PM  Result Value Ref Range   Alcohol, Ethyl (B) <10 <10 mg/dL    Comment: (NOTE) Lowest detectable limit for serum alcohol is 10 mg/dL.  For medical purposes only. Performed at [facility], [place] [address] [place],  [address]    CT Head Wo Contrast  Result Date: 06/18/2022 CLINICAL DATA:  Mental status EXAM: CT HEAD WITHOUT CONTRAST TECHNIQUE: Contiguous axial images were obtained from the base of the skull through the vertex without intravenous contrast. RADIATION DOSE REDUCTION: This exam was performed according to the departmental dose-optimization program which includes automated exposure control, adjustment of the mA and/or kV according to patient size and/or use of iterative reconstruction technique. COMPARISON:  CT head 1 day prior FINDINGS: Brain: There is no acute intracranial hemorrhage, extra-axial fluid collection, or acute infarct. Parenchymal volume is normal. The ventricles are normal in size. Gray-white differentiation is preserved. There is no mass lesion.  There  is no mass effect or midline shift. Vascular: No hyperdense vessel or unexpected calcification. Skull: Normal. Negative for fracture or focal lesion. Sinuses/Orbits: There is a small amount of layering fluid in the bilateral maxillary sinuses. Bilateral lens implants are in place. The globes and orbits are otherwise unremarkable. Other: A presumed stimulator device is again seen overlying the frontal bone. IMPRESSION: Stable head CT with no acute intracranial pathology. Electronically Signed   By: Valetta Mole M.D.   On: 06/18/2022 16:34   DG Chest Portable 1 View  Result Date: 06/18/2022 CLINICAL DATA:  Altered mental status.  COVID positive. EXAM: PORTABLE CHEST 1 VIEW COMPARISON:  06/19/2019 FINDINGS: Moderate right hemidiaphragm elevation is chronic. Battery for brain stimulator projects over the left chest. Midline trachea. Normal heart size for level of inspiration. Atherosclerosis in the transverse aorta. No pleural effusion or pneumothorax. Right greater than left base scarring and/or atelectasis, relatively similar. No lobar consolidation. IMPRESSION: No acute cardiopulmonary disease. Moderate right hemidiaphragm elevation with similar bibasilar scarring/atelectasis. Aortic Atherosclerosis (ICD10-I70.0). Electronically Signed   By: Abigail Miyamoto M.D.   On: 06/18/2022 16:05    Pending Labs Unresulted Labs (From admission, onward)     Start     Ordered   06/18/22 1546  Urinalysis, Routine w reflex microscopic  ONCE - URGENT,   URGENT        06/18/22 1545   06/18/22 1546  Rapid urine drug screen (hospital performed)  ONCE - STAT,   STAT        06/18/22 1545            Vitals/Pain Today's Vitals   06/18/22 1730 06/18/22 1800 06/18/22 1830 06/18/22 1921  BP: 115/75 109/87  139/89  Pulse: 67 72 95 94  Resp: '18  18 18  '$ Temp:    97.6 F (36.4 C)  TempSrc:    Oral  SpO2: 98% 92% 95% 93%  Weight:      Height:      PainSc:        Isolation Precautions Airborne and Contact  precautions  Medications Medications - No data to display  Mobility walks

## 2022-06-18 NOTE — H&P (Signed)
History and Physical    Robert Cummings. WHQ:[id number] DOB: October 24, 1946 DOA: 06/18/2022  PCP: Shelly Coss, MD  Patient coming from:  ALF  Chief Complaint: Altered mental status, COVID-positive  HPI: Robert Cummings. is a 76 y.o. male with medical history significant of chronic alcohol abuse, mild to moderate dementia, history of concussion/TBI in 2000, seizure disorder, depression, chronic thrombocytopenia, hyperlipidemia, hypothyroidism presents to the ED from his assisted living facility for evaluation of altered mental status/confusion.  Patient tested positive for COVID 2 days ago at his facility.  Staff reported that patient is a daily alcohol user but has not had alcohol in the last few days.  In the ED, vital signs stable.  Labs showing no leukocytosis.  Stable chronic mild thrombocytopenia.  Not hypoglycemic.  Bicarb slightly low with normal anion gap.  Renal function stable.  LFTs normal.  SARS-CoV-2 PCR positive.  Ammonia level normal.  Blood ethanol level undetectable.  UA and UDS pending.  Chest x-ray showing moderate right hemidiaphragm elevation with similar bibasilar scarring/atelectasis and no acute cardiopulmonary disease.  CT head negative for acute finding.  Patient appears mildly confused.  He has no complaints.  Denies fevers, chills, cough, shortness of breath, chest pain, nausea, vomiting, abdominal pain, diarrhea, dysuria, or urinary frequency/urgency.  States he moved to [facility] assisted [facility] facility 2 years ago after his wife passed away.  He thinks he has been vaccinated against COVID in the past but is not sure how many vaccines he received.  He reports history of COVID infection back in [place] 2 years ago.  States he drinks alcohol every once in a while and has not had any drinks in the past 1 week.  Review of Systems:  Review of Systems  All other systems reviewed and are negative.   Past Medical History:  Diagnosis Date   Cancer ([place])    skin,  prostate   Concussion    15-20 yrs ago, due to fall, cognitive dysfunction   Dementia ([place])    Depression    Post-traumatic headache    syndrome   Seizures ([place])    08/2019 began 15-20 yrs ago, recent 06/18/19    Past Surgical History:  Procedure Laterality Date   OTHER SURGICAL HISTORY     pain management system implanted, 10-15 years ago   PROSTATECTOMY       reports that he quit smoking about 12 years ago. His smoking use included cigarettes. He has never used smokeless tobacco. He reports current alcohol use of about 7.0 standard drinks of alcohol per week. He reports that he does not currently use drugs after having used the following drugs: Marijuana.  Allergies  Allergen Reactions   Cephalexin Other (See Comments)    Brother cannot confirm   Ciprofloxacin Other (See Comments)    Brother cannot confirm   Codeine Other (See Comments)    Brother cannot confirm   Other Other (See Comments)    Ragweed- Runny nose, itchy eyes, and congestion also    Family History  Problem Relation Age of Onset   Stroke Mother    Heart disease Father    Prostate cancer Father    Blindness Brother     Prior to Admission medications   Medication Sig Start Date End Date Taking? Authorizing Provider  acetaminophen (TYLENOL) 325 MG tablet Take 650 mg by mouth daily.    [provider]  atorvastatin (LIPITOR) 20 MG tablet Take 20 mg by mouth at bedtime.  [provider]  Azelastine HCl 0.15 % SOLN Place 2 sprays into both nostrils 2 (two) times daily.  05/25/19   [provider]  brimonidine (ALPHAGAN) 0.2 % ophthalmic solution Place 1 drop into both eyes 2 (two) times daily. 05/27/19   [provider]  cetirizine (ZYRTEC) 10 MG tablet Take 10 mg by mouth daily.    [provider]  fluticasone (FLONASE) 50 MCG/ACT nasal spray Place 2 sprays into both nostrils daily.    [provider]  levETIRAcetam (KEPPRA) 500 MG tablet Take 1 tablet  (500 mg total) by mouth 2 (two) times daily. 12/30/21   Penumalli, Earlean Polka, MD  levothyroxine (SYNTHROID) 25 MCG tablet Take 37.5 mcg by mouth daily before breakfast.    [provider]  loperamide (IMODIUM A-D) 2 MG tablet Take 2 mg by mouth 4 (four) times daily as needed for diarrhea or loose stools.    [provider]  montelukast (SINGULAIR) 10 MG tablet Take 10 mg by mouth at bedtime.    [provider]  Multiple Vitamin (MULTIVITAMIN WITH MINERALS) TABS tablet Take 1 tablet by mouth daily.    [provider]  ondansetron (ZOFRAN) 4 MG tablet Take 4 mg by mouth every 8 (eight) hours as needed for nausea or vomiting.    [provider]  potassium chloride SA (K-DUR) 20 MEQ tablet Take 1 tablet (20 mEq total) by mouth daily. Take 40 mg (2 pills) daily  for 5 days then continue one pill daily 06/24/19   Shelly Coss, MD  sertraline (ZOLOFT) 50 MG tablet Take 75 mg by mouth every morning. 03/22/19   [provider]  timolol (TIMOPTIC) 0.5 % ophthalmic solution Place 1 drop into both eyes 2 (two) times daily. 12/21/17   [provider]  traZODone (DESYREL) 50 MG tablet Take 25 mg by mouth at bedtime.    [provider]  valACYclovir (VALTREX) 500 MG tablet Take 500 mg by mouth daily.    [provider]    Physical Exam: Vitals:   06/18/22 1730 06/18/22 1800 06/18/22 1830 06/18/22 1921  BP: 115/75 109/87  139/89  Pulse: 67 72 95 94  Resp: '18  18 18  '$ Temp:    97.6 F (36.4 C)  TempSrc:    Oral  SpO2: 98% 92% 95% 93%  Weight:      Height:        Physical Exam Vitals reviewed.  Constitutional:      General: He is not in acute distress. HENT:     Head: Normocephalic and atraumatic.  Eyes:     Extraocular Movements: Extraocular movements intact.  Cardiovascular:     Rate and Rhythm: Normal rate and regular rhythm.     Pulses: Normal pulses.  Pulmonary:     Effort: Pulmonary effort is normal. No  respiratory distress.     Breath sounds: No wheezing.  Abdominal:     General: Bowel sounds are normal. There is no distension.     Palpations: Abdomen is soft.     Tenderness: There is no abdominal tenderness.  Musculoskeletal:        General: No swelling or tenderness.     Cervical back: Normal range of motion.  Skin:    General: Skin is warm and dry.  Neurological:     General: No focal deficit present.     Mental Status: He is alert and oriented to person, place, and time.     Cranial Nerves: No cranial  nerve deficit.     Sensory: No sensory deficit.     Motor: No weakness.      Labs on Admission: I have personally reviewed following labs and imaging studies  CBC: Recent Labs  Lab 06/18/22 1604  WBC 4.3  NEUTROABS 2.1  HGB 14.5  HCT 41.2  MCV 91.4  PLT 295*   Basic Metabolic Panel: Recent Labs  Lab 06/18/22 1604  NA 136  K 3.9  CL 104  CO2 19*  GLUCOSE 97  BUN 11  CREATININE 0.96  CALCIUM 8.9   GFR: Estimated Creatinine Clearance: 75.6 mL/min (by C-G formula based on SCr of 0.96 mg/dL). Liver Function Tests: Recent Labs  Lab 06/18/22 1604  AST 37  ALT 30  ALKPHOS 52  BILITOT 1.2  PROT 7.4  ALBUMIN 4.3   No results for input(s): "LIPASE", "AMYLASE" in the last 168 hours. Recent Labs  Lab 06/18/22 1604  AMMONIA 21   Coagulation Profile: No results for input(s): "INR", "PROTIME" in the last 168 hours. Cardiac Enzymes: No results for input(s): "CKTOTAL", "CKMB", "CKMBINDEX", "TROPONINI" in the last 168 hours. BNP (last 3 results) No results for input(s): "PROBNP" in the last 8760 hours. HbA1C: No results for input(s): "HGBA1C" in the last 72 hours. CBG: Recent Labs  Lab 06/18/22 1517  GLUCAP 101*   Lipid Profile: No results for input(s): "CHOL", "HDL", "LDLCALC", "TRIG", "CHOLHDL", "LDLDIRECT" in the last 72 hours. Thyroid Function Tests: No results for input(s): "TSH", "T4TOTAL", "FREET4", "T3FREE", "THYROIDAB" in the last 72  hours. Anemia Panel: No results for input(s): "VITAMINB12", "FOLATE", "FERRITIN", "TIBC", "IRON", "RETICCTPCT" in the last 72 hours. Urine analysis:    Component Value Date/Time   COLORURINE YELLOW 06/18/2019 [place] 06/18/2019 1528   LABSPEC 1.013 06/18/2019 1528   PHURINE 5.0 06/18/2019 1528   GLUCOSEU NEGATIVE 06/18/2019 1528   HGBUR MODERATE (A) 06/18/2019 1528   BILIRUBINUR NEGATIVE 06/18/2019 [place] 06/18/2019 1528   PROTEINUR 100 (A) 06/18/2019 1528   NITRITE NEGATIVE 06/18/2019 [place] 06/18/2019 1528    Radiological Exams on Admission: I have personally reviewed images CT Head Wo Contrast  Result Date: 06/18/2022 CLINICAL DATA:  Mental status EXAM: CT HEAD WITHOUT CONTRAST TECHNIQUE: Contiguous axial images were obtained from the base of the skull through the vertex without intravenous contrast. RADIATION DOSE REDUCTION: This exam was performed according to the departmental dose-optimization program which includes automated exposure control, adjustment of the mA and/or kV according to patient size and/or use of iterative reconstruction technique. COMPARISON:  CT head 1 day prior FINDINGS: Brain: There is no acute intracranial hemorrhage, extra-axial fluid collection, or acute infarct. Parenchymal volume is normal. The ventricles are normal in size. Gray-white differentiation is preserved. There is no mass lesion.  There is no mass effect or midline shift. Vascular: No hyperdense vessel or unexpected calcification. Skull: Normal. Negative for fracture or focal lesion. Sinuses/Orbits: There is a small amount of layering fluid in the bilateral maxillary sinuses. Bilateral lens implants are in place. The globes and orbits are otherwise unremarkable. Other: A presumed stimulator device is again seen overlying the frontal bone. IMPRESSION: Stable head CT with no acute intracranial pathology. Electronically Signed   By: Valetta Mole  M.D.   On: 06/18/2022 16:34   DG Chest Portable 1 View  Result Date: 06/18/2022 CLINICAL DATA:  Altered mental status.  COVID positive. EXAM: PORTABLE CHEST 1 VIEW COMPARISON:  06/19/2019 FINDINGS: Moderate right hemidiaphragm elevation is chronic.  Battery for brain stimulator projects over the left chest. Midline trachea. Normal heart size for level of inspiration. Atherosclerosis in the transverse aorta. No pleural effusion or pneumothorax. Right greater than left base scarring and/or atelectasis, relatively similar. No lobar consolidation. IMPRESSION: No acute cardiopulmonary disease. Moderate right hemidiaphragm elevation with similar bibasilar scarring/atelectasis. Aortic Atherosclerosis (ICD10-I70.0). Electronically Signed   By: Abigail Miyamoto M.D.   On: 06/18/2022 16:05    EKG: Independently reviewed.  Sinus rhythm, no acute changes.  Assessment and Plan  COVID-19 infection Patient tested positive at his facility 2 days ago.  Not hypoxic and chest x-ray not suggestive of pneumonia.  Discussed with lab, CT value 23.7. -Start 5-day course of Paxlovid -No indication for steroids at this time -Check ferritin, fibrinogen, D-dimer, CRP, LDH, procalcitonin -Airborne and contact precautions -Continuous pulse ox, supplemental oxygen as needed  Acute metabolic encephalopathy Likely exacerbated by acute viral illness in the setting of underlying mild to moderate dementia and chronic alcohol abuse.  Not hypoglycemic.  Ammonia level normal.  Appears mildly confused at this time.  CT head negative for acute finding and no focal neurodeficit on exam. -UA and UDS pending -Check TSH and B12 levels  Mild normal anion gap metabolic acidosis Renal function stable.  Not on any diuretics.  No diarrhea reported. -IV fluid hydration, repeat labs and replace if bicarb continues to be low  Chronic alcohol abuse No signs of withdrawal at this time. -CIWA protocol; Ativan as needed -Thiamine, folate,  multivitamin  History of seizure disorder -Continue Keppra after pharmacy med rec is done.  Depression -Continue home meds after pharmacy med rec is done.  Chronic thrombocytopenia Mild and stable, likely due to chronic alcohol abuse. -Continue to monitor  Hyperlipidemia -Continue statin after pharmacy med rec is done.  Hypothyroidism -Check TSH and continue Synthroid after pharmacy med rec is done.  DVT prophylaxis: Lovenox Code Status: Full Code (discussed with the patient) Family Communication: No family available at this time. Level of care: Telemetry bed Admission status: It is my clinical opinion that referral for OBSERVATION is reasonable and necessary in this patient based on the above information provided. The aforementioned taken together are felt to place the patient at high risk for further clinical deterioration. However, it is anticipated that the patient may be medically stable for discharge from the hospital within 24 to 48 hours.   Shela Leff MD [facility]  If 7PM-7AM, please contact night-coverage [email or web address]  06/18/2022, 8:33 PM

## 2022-06-18 NOTE — ED Provider Notes (Signed)
[facility] DEPT Provider Note   CSN: [id number] Arrival date & time: 06/18/22  1507     History  Chief Complaint  Patient presents with   Altered Mental Status    Robert Cummings. is a 76 y.o. male with history of some cognitive decline, who lives in [facility] assisted living, present emergency department with complaint of altered mental status.  Patient was referred in by staff for reports that patient seen more confused recently.  Patient tested positive for COVID 2 days ago as an outpatient.  Patient is reporting episodes of incontinence.  He is overall a poor historian.  He reports he "always has a headache".  He reports he feels short of breath "always".  Staff reported patient is a daily alcohol user, per history gathered from EMS  HPI     Home Medications Prior to Admission medications   Medication Sig Start Date End Date Taking? Authorizing Provider  acetaminophen (TYLENOL) 325 MG tablet Take 650 mg by mouth daily.    [provider]  atorvastatin (LIPITOR) 20 MG tablet Take 20 mg by mouth at bedtime.    [provider]  Azelastine HCl 0.15 % SOLN Place 2 sprays into both nostrils 2 (two) times daily.  05/25/19   [provider]  brimonidine (ALPHAGAN) 0.2 % ophthalmic solution Place 1 drop into both eyes 2 (two) times daily. 05/27/19   [provider]  cetirizine (ZYRTEC) 10 MG tablet Take 10 mg by mouth daily.    [provider]  fluticasone (FLONASE) 50 MCG/ACT nasal spray Place 2 sprays into both nostrils daily.    [provider]  levETIRAcetam (KEPPRA) 500 MG tablet Take 1 tablet (500 mg total) by mouth 2 (two) times daily. 12/30/21   Penumalli, Earlean Polka, MD  levothyroxine (SYNTHROID) 25 MCG tablet Take 37.5 mcg by mouth daily before breakfast.    [provider]  loperamide (IMODIUM A-D) 2 MG tablet Take 2 mg by mouth 4 (four) times daily as needed for diarrhea or loose  stools.    [provider]  montelukast (SINGULAIR) 10 MG tablet Take 10 mg by mouth at bedtime.    [provider]  Multiple Vitamin (MULTIVITAMIN WITH MINERALS) TABS tablet Take 1 tablet by mouth daily.    [provider]  ondansetron (ZOFRAN) 4 MG tablet Take 4 mg by mouth every 8 (eight) hours as needed for nausea or vomiting.    [provider]  potassium chloride SA (K-DUR) 20 MEQ tablet Take 1 tablet (20 mEq total) by mouth daily. Take 40 mg (2 pills) daily  for 5 days then continue one pill daily 06/24/19   Shelly Coss, MD  sertraline (ZOLOFT) 50 MG tablet Take 75 mg by mouth every morning. 03/22/19   [provider]  timolol (TIMOPTIC) 0.5 % ophthalmic solution Place 1 drop into both eyes 2 (two) times daily. 12/21/17   [provider]  traZODone (DESYREL) 50 MG tablet Take 25 mg by mouth at bedtime.    [provider]  valACYclovir (VALTREX) 500 MG tablet Take 500 mg by mouth daily.    [provider]      Allergies    Cephalexin, Ciprofloxacin, Codeine, and Other    Review of Systems   Review of Systems  Physical Exam Updated Vital Signs BP 138/78   Pulse 61   Temp 98.9 F (37.2 C) (Oral)   Resp 14   Ht '5\' 10"'$  (1.778 m)  Wt 88.5 kg   SpO2 98%   BMI 27.99 kg/m  Physical Exam Constitutional:      General: He is not in acute distress. HENT:     Head: Normocephalic and atraumatic.  Eyes:     Conjunctiva/sclera: Conjunctivae normal.     Pupils: Pupils are equal, round, and reactive to light.  Cardiovascular:     Rate and Rhythm: Normal rate and regular rhythm.  Pulmonary:     Effort: Pulmonary effort is normal. No respiratory distress.  Abdominal:     General: There is no distension.     Tenderness: There is no abdominal tenderness.  Skin:    General: Skin is warm and dry.  Neurological:     General: No focal deficit present.     Mental Status: He is alert and oriented to person, place, and  time. Mental status is at baseline.     ED Results / Procedures / Treatments   Labs (all labs ordered are listed, but only abnormal results are displayed) Labs Reviewed  CBC WITH DIFFERENTIAL/PLATELET - Abnormal; Notable for the following components:      Result Value   RDW 11.4 (*)    Platelets 133 (*)    All other components within normal limits  COMPREHENSIVE METABOLIC PANEL - Abnormal; Notable for the following components:   CO2 19 (*)    All other components within normal limits  CBG MONITORING, ED - Abnormal; Notable for the following components:   Glucose-Capillary 101 (*)    All other components within normal limits  RESP PANEL BY RT-PCR (FLU A&B, COVID) [id number]  AMMONIA  ETHANOL  URINALYSIS, ROUTINE W REFLEX MICROSCOPIC  RAPID URINE DRUG SCREEN, HOSP PERFORMED    EKG EKG Interpretation  Date/Time:  Wednesday June 18 2022 15:56:12 EDT Ventricular Rate:  66 PR Interval:  178 QRS Duration: 130 QT Interval:  428 QTC Calculation: 449 R Axis:   -60 Text Interpretation: Sinus rhythm Left bundle branch block Confirmed by Octaviano Glow [phone]) on 06/18/2022 4:38:14 PM  Radiology CT Head Wo Contrast  Result Date: 06/18/2022 CLINICAL DATA:  Mental status EXAM: CT HEAD WITHOUT CONTRAST TECHNIQUE: Contiguous axial images were obtained from the base of the skull through the vertex without intravenous contrast. RADIATION DOSE REDUCTION: This exam was performed according to the departmental dose-optimization program which includes automated exposure control, adjustment of the mA and/or kV according to patient size and/or use of iterative reconstruction technique. COMPARISON:  CT head 1 day prior FINDINGS: Brain: There is no acute intracranial hemorrhage, extra-axial fluid collection, or acute infarct. Parenchymal volume is normal. The ventricles are normal in size. Gray-white differentiation is preserved. There is no mass lesion.  There is no mass effect or midline shift.  Vascular: No hyperdense vessel or unexpected calcification. Skull: Normal. Negative for fracture or focal lesion. Sinuses/Orbits: There is a small amount of layering fluid in the bilateral maxillary sinuses. Bilateral lens implants are in place. The globes and orbits are otherwise unremarkable. Other: A presumed stimulator device is again seen overlying the frontal bone. IMPRESSION: Stable head CT with no acute intracranial pathology. Electronically Signed   By: Valetta Mole M.D.   On: 06/18/2022 16:34   DG Chest Portable 1 View  Result Date: 06/18/2022 CLINICAL DATA:  Altered mental status.  COVID positive. EXAM: PORTABLE CHEST 1 VIEW COMPARISON:  06/19/2019 FINDINGS: Moderate right hemidiaphragm elevation is chronic. Battery for brain stimulator projects over the left chest. Midline trachea. Normal heart size for level of inspiration. Atherosclerosis  in the transverse aorta. No pleural effusion or pneumothorax. Right greater than left base scarring and/or atelectasis, relatively similar. No lobar consolidation. IMPRESSION: No acute cardiopulmonary disease. Moderate right hemidiaphragm elevation with similar bibasilar scarring/atelectasis. Aortic Atherosclerosis (ICD10-I70.0). Electronically Signed   By: Abigail Miyamoto M.D.   On: 06/18/2022 16:05    Procedures Procedures    Medications Ordered in ED Medications - No data to display  ED Course/ Medical Decision Making/ A&P Clinical Course as of 06/18/22 1714  Wed Jun 18, 2022  1658 Received sign out from Dr. Langston Masker presenting to the ED with confusion.  [WS]    Clinical Course User Index [WS] Cristie Hem, MD                           Medical Decision Making Amount and/or Complexity of Data Reviewed Labs: ordered. Radiology: ordered. ECG/medicine tests: ordered.   This patient presents to the Emergency Department with complaint of altered mental status.  This involves an extensive number of treatment options, and is a complaint that  carries with it a high risk of complications and morbidity.  The differential diagnosis includes hypoglycemia vs metabolic encephalopathy vs infection (including cystitis) vs ICH vs stroke vs polypharmacy vs other  Most likely this is the patient underlying dementia or cognitive disease now exacerbated by acute COVID illness.  He is well-appearing clinically.  No hypoxia, no tachypnea.  He is not requiring oxygen.  I ordered, reviewed, and interpreted labs, including no emergent findings. I ordered imaging studies which included CT of the head, x-ray of the chest I independently visualized and interpreted imaging which showed no emergent findings and the monitor tracing which showed normal sinus rhythm. Additional history was obtained from EMS at bedside I personally reviewed the patients ECG which showed sinus rhythm with no acute ischemic findings  After the interventions stated above, I reevaluated the patient and found this to well.  Patient is pending a COVID test.  If positive he will likely need admission for acute COVID-related encephalopathy, and may consider antiviral medication given the onset of his symptoms timing.         Final Clinical Impression(s) / ED Diagnoses Final diagnoses:  None    Rx / DC Orders ED Discharge Orders     None         Cozette Braggs, Carola Rhine, MD 06/18/22 1714

## 2022-06-19 DIAGNOSIS — G9341 Metabolic encephalopathy: Secondary | ICD-10-CM | POA: Diagnosis not present

## 2022-06-19 DIAGNOSIS — G40909 Epilepsy, unspecified, not intractable, without status epilepticus: Secondary | ICD-10-CM | POA: Diagnosis not present

## 2022-06-19 DIAGNOSIS — F1010 Alcohol abuse, uncomplicated: Secondary | ICD-10-CM | POA: Diagnosis not present

## 2022-06-19 DIAGNOSIS — U071 COVID-19: Secondary | ICD-10-CM | POA: Diagnosis not present

## 2022-06-19 LAB — COMPREHENSIVE METABOLIC PANEL
ALT: 28 U/L (ref 0–44)
AST: 34 U/L (ref 15–41)
Albumin: 3.8 g/dL (ref 3.5–5.0)
Alkaline Phosphatase: 51 U/L (ref 38–126)
Anion gap: 8 (ref 5–15)
BUN: 13 mg/dL (ref 8–23)
CO2: 25 mmol/L (ref 22–32)
Calcium: 8.3 mg/dL — ABNORMAL LOW (ref 8.9–10.3)
Chloride: 104 mmol/L (ref 98–111)
Creatinine, Ser: 0.91 mg/dL (ref 0.61–1.24)
GFR, Estimated: >60 mL/min (ref >60)
Glucose, Bld: 98 mg/dL (ref 70–99)
Potassium: 3.3 mmol/L — ABNORMAL LOW (ref 3.5–5.1)
Sodium: 137 mmol/L (ref 135–145)
Total Bilirubin: 0.8 mg/dL (ref 0.3–1.2)
Total Protein: 6.5 g/dL (ref 6.5–8.1)

## 2022-06-19 LAB — CBC WITH DIFFERENTIAL/PLATELET
Abs Immature Granulocytes: 0.02 10*3/uL (ref 0.00–0.07)
Basophils Absolute: 0 10*3/uL (ref 0.0–0.1)
Basophils Relative: 0 %
Eosinophils Absolute: 0 10*3/uL (ref 0.0–0.5)
Eosinophils Relative: 0 %
HCT: 39.6 % (ref 39.0–52.0)
Hemoglobin: 13.5 g/dL (ref 13.0–17.0)
Immature Granulocytes: 0 %
Lymphocytes Relative: 32 %
Lymphs Abs: 1.6 10*3/uL (ref 0.7–4.0)
MCH: 32.4 pg (ref 26.0–34.0)
MCHC: 34.1 g/dL (ref 30.0–36.0)
MCV: 95 fL (ref 80.0–100.0)
Monocytes Absolute: 0.8 10*3/uL (ref 0.1–1.0)
Monocytes Relative: 17 %
Neutro Abs: 2.5 10*3/uL (ref 1.7–7.7)
Neutrophils Relative %: 51 %
Platelets: 123 10*3/uL — ABNORMAL LOW (ref 150–400)
RBC: 4.17 MIL/uL — ABNORMAL LOW (ref 4.22–5.81)
RDW: 11.4 % — ABNORMAL LOW (ref 11.5–15.5)
WBC: 4.9 10*3/uL (ref 4.0–10.5)
nRBC: 0 % (ref 0.0–0.2)

## 2022-06-19 LAB — URINALYSIS, ROUTINE W REFLEX MICROSCOPIC
Bilirubin Urine: NEGATIVE
Glucose, UA: NEGATIVE mg/dL
Hgb urine dipstick: NEGATIVE
Ketones, ur: NEGATIVE mg/dL
Leukocytes,Ua: NEGATIVE
Nitrite: NEGATIVE
Protein, ur: NEGATIVE mg/dL
Specific Gravity, Urine: 1.008 (ref 1.005–1.030)
pH: 6 (ref 5.0–8.0)

## 2022-06-19 LAB — RAPID URINE DRUG SCREEN, HOSP PERFORMED
Amphetamines: NOT DETECTED
Barbiturates: NOT DETECTED
Benzodiazepines: NOT DETECTED
Cocaine: NOT DETECTED
Opiates: NOT DETECTED
Tetrahydrocannabinol: NOT DETECTED

## 2022-06-19 LAB — C-REACTIVE PROTEIN: CRP: 2.9 mg/dL — ABNORMAL HIGH (ref <1.0)

## 2022-06-19 LAB — PROCALCITONIN: Procalcitonin: <0.1 ng/mL

## 2022-06-19 LAB — MAGNESIUM: Magnesium: 1.9 mg/dL (ref 1.7–2.4)

## 2022-06-19 MED ORDER — LEVETIRACETAM 500 MG PO TABS
500.0000 mg | ORAL_TABLET | Freq: Two times a day (BID) | ORAL | Status: DC
  Administered 2022-06-19 – 2022-06-26 (×14): 500 mg via ORAL
  Filled 2022-06-19 (×14): qty 1

## 2022-06-19 MED ORDER — MONTELUKAST SODIUM 10 MG PO TABS
10.0000 mg | ORAL_TABLET | Freq: Every day | ORAL | Status: DC
  Administered 2022-06-19 – 2022-06-25 (×7): 10 mg via ORAL
  Filled 2022-06-19 (×7): qty 1

## 2022-06-19 MED ORDER — LORATADINE 10 MG PO TABS
10.0000 mg | ORAL_TABLET | Freq: Every day | ORAL | Status: DC
  Administered 2022-06-20 – 2022-06-26 (×7): 10 mg via ORAL
  Filled 2022-06-19 (×7): qty 1

## 2022-06-19 MED ORDER — LEVOTHYROXINE SODIUM 25 MCG PO TABS
37.5000 ug | ORAL_TABLET | Freq: Every day | ORAL | Status: DC
  Administered 2022-06-20 – 2022-06-26 (×7): 37.5 ug via ORAL
  Filled 2022-06-19 (×7): qty 2

## 2022-06-19 MED ORDER — TRAZODONE HCL 50 MG PO TABS
25.0000 mg | ORAL_TABLET | Freq: Every day | ORAL | Status: DC
  Administered 2022-06-19 – 2022-06-25 (×7): 25 mg via ORAL
  Filled 2022-06-19 (×7): qty 1

## 2022-06-19 MED ORDER — SERTRALINE HCL 100 MG PO TABS: 100.0000 mg | ORAL_TABLET | Freq: Every day | ORAL | Status: DC

## 2022-06-19 MED ORDER — POTASSIUM CHLORIDE CRYS ER 20 MEQ PO TBCR
40.0000 meq | EXTENDED_RELEASE_TABLET | Freq: Once | ORAL | Status: AC
  Administered 2022-06-19: 40 meq via ORAL
  Filled 2022-06-19: qty 2

## 2022-06-19 MED ORDER — BRIMONIDINE TARTRATE 0.2 % OP SOLN
1.0000 [drp] | Freq: Two times a day (BID) | OPHTHALMIC | Status: DC
Start: 2022-06-19 — End: 2022-06-26
  Administered 2022-06-19 – 2022-06-26 (×14): 1 [drp] via OPHTHALMIC
  Filled 2022-06-19: qty 5

## 2022-06-19 MED ORDER — VALACYCLOVIR HCL 500 MG PO TABS
500.0000 mg | ORAL_TABLET | Freq: Every morning | ORAL | Status: DC
  Administered 2022-06-20 – 2022-06-26 (×7): 500 mg via ORAL
  Filled 2022-06-19 (×7): qty 1

## 2022-06-19 MED ORDER — TIMOLOL MALEATE 0.5 % OP SOLN
1.0000 [drp] | Freq: Two times a day (BID) | OPHTHALMIC | Status: DC
Start: 2022-06-19 — End: 2022-06-26
  Administered 2022-06-19 – 2022-06-26 (×14): 1 [drp] via OPHTHALMIC
  Filled 2022-06-19: qty 5

## 2022-06-19 MED ORDER — ATORVASTATIN CALCIUM 20 MG PO TABS: 20.0000 mg | ORAL_TABLET | Freq: Every day | ORAL | Status: DC

## 2022-06-19 NOTE — Progress Notes (Signed)
PT showing >919 in bladder and unable to void. Order received from Olena Heckle, NP to straight cath. Unable to advance catheter. PT with penis pump. Olena Heckle and charge Pam made aware. Will attempt a 14 french catheter

## 2022-06-19 NOTE — Progress Notes (Signed)
[facility] PROGRESS NOTE   Robert D Faraj Jr. LKG:[id number] DOB: [birth year] DOA: 06/18/2022  PCP: Shelly Coss, MD  Brief History/Interval Summary: 76 y.o. male with medical history significant of chronic alcohol abuse, mild to moderate dementia, history of concussion/TBI in 2000, seizure disorder, depression, chronic thrombocytopenia, hyperlipidemia, hypothyroidism presented to the ED from his assisted living facility for evaluation of altered mental status/confusion.  Patient tested positive for COVID 2 days prior to admission at his facility.  Staff reported that patient is a daily alcohol user but has not had alcohol in a few days.    Consultants: None  Procedures: None    Subjective/Interval History: Patient noted to be distracted.  Pleasant.  Denies any shortness of breath or pain issues.    Assessment/Plan:  Acute metabolic encephalopathy Thought to be triggered by COVID-19 infection.  CT head was negative for acute findings.  No focal neurological deficits on examination. TSH noted to be normal.  B12 level 462.  UA was unremarkable.  Urine drug screen unremarkable.  Alcohol level less than 10. Continue to reorient daily.  No clear indication to do additional studies at this time.  COVID-19 infection Chest x-ray did not show any acute findings.  He is not hypoxic.  Does not have any respiratory symptoms.  CRP is only 2.9.  Started on Paxlovid.  No indication for steroids at this time.  Hypokalemia Will be repleted.  Magnesium 1.9.  Mild normal anion gap metabolic acidosis Resolved.  Chronic alcohol abuse No evidence of withdrawal.  Continue multivitamin thiamine folate.  Seizure disorder Continue Keppra.  Depression Continue with home medications.  Chronic thrombocytopenia Stable.  Hyperlipidemia Continue statin.  Hypothyroidism TSH normal.  Continue levothyroxine.   DVT Prophylaxis: Lovenox Code Status: Full code Family Communication:  Discussed with patient Disposition Plan: Hopefully return back to his assisted living facility in 24 to 48 hours  Status is: Observation The patient will require care spanning > 2 midnights and should be moved to inpatient because: Continued altered mental status      Medications: Scheduled:  atorvastatin  20 mg Oral QHS   brimonidine  1 drop Both Eyes BID   enoxaparin (LOVENOX) injection  40 mg Subcutaneous QHS   folic acid  1 mg Oral Daily   levETIRAcetam  500 mg Oral BID   [START ON 06/20/2022] levothyroxine  37.5 mcg Oral QAC breakfast   loratadine  10 mg Oral Daily   montelukast  10 mg Oral QHS   multivitamin with minerals  1 tablet Oral Daily   nirmatrelvir/ritonavir EUA  3 tablet Oral BID   [START ON 06/20/2022] sertraline  100 mg Oral q AM   thiamine  100 mg Oral Daily   Or   thiamine  100 mg Intravenous Daily   timolol  1 drop Both Eyes BID   traZODone  25 mg Oral QHS   [START ON 06/20/2022] valACYclovir  500 mg Oral q AM   Continuous: [name]:[id number], LORazepam **OR** LORazepam  Antibiotics: Anti-infectives (From admission, onward)    Start     Dose/Rate Route Frequency Ordered Stop   06/20/22 0700  valACYclovir (VALTREX) tablet 500 mg        500 mg Oral Every morning 06/19/22 1141     06/18/22 2230  nirmatrelvir/ritonavir EUA (PAXLOVID) 3 tablet        3 tablet Oral 2 times daily 06/18/22 2102 06/23/22 2159       Objective:  Vital Signs  Vitals:   06/19/22 0500 06/19/22  0700 06/19/22 0958 06/19/22 1004  BP: (!) 141/86 137/85  (!) 147/89  Pulse: 73 71  78  Resp: 18  (!) 23 20  Temp: 99.9 F (37.7 C)     TempSrc: Oral     SpO2: 90%     Weight:      Height:        Intake/Output Summary (Last 24 hours) at 06/19/2022 1142 Last data filed at 06/19/2022 0700 Gross per 24 hour  Intake 894.29 ml  Output 925 ml  Net -30.71 ml   Filed Weights   06/18/22 1532 06/18/22 2232  Weight: 88.5 kg 87.2 kg    General appearance: Awake alert.  In no  distress.  Mildly distracted. Resp: Clear to auscultation bilaterally.  Normal effort Cardio: S1-S2 is normal regular.  No S3-S4.  No rubs murmurs or bruit GI: Abdomen is soft.  Nontender nondistended.  Bowel sounds are present normal.  No masses organomegaly Extremities: No edema.  Full range of motion of lower extremities. Neurologic: He is alert.  Oriented to state.  Oriented to month but did not know the year.  Cranial nerves II to XII intact.  Motor strength equal bilateral upper and lower extremities.   Lab Results:  Data Reviewed: I have personally reviewed following labs and reports of the imaging studies  CBC: Recent Labs  Lab 06/18/22 1604 06/19/22 0418  WBC 4.3 4.9  NEUTROABS 2.1 2.5  HGB 14.5 13.5  HCT 41.2 39.6  MCV 91.4 95.0  PLT 133* 123*    Basic Metabolic Panel: Recent Labs  Lab 06/18/22 1604 06/19/22 0409 06/19/22 0418  NA 136  --  137  K 3.9  --  3.3*  CL 104  --  104  CO2 19*  --  25  GLUCOSE 97  --  98  BUN 11  --  13  CREATININE 0.96  --  0.91  CALCIUM 8.9  --  8.3*  MG  --  1.9  --     GFR: Estimated Creatinine Clearance: 75.9 mL/min (by C-G formula based on SCr of 0.91 mg/dL).  Liver Function Tests: Recent Labs  Lab 06/18/22 1604 06/19/22 0418  AST 37 34  ALT 30 28  ALKPHOS 52 51  BILITOT 1.2 0.8  PROT 7.4 6.5  ALBUMIN 4.3 3.8    Recent Labs  Lab 06/18/22 1604  AMMONIA 21      CBG: Recent Labs  Lab 06/18/22 1517  GLUCAP 101*    Thyroid Function Tests: Recent Labs    06/18/22 2141  TSH 3.654    Anemia Panel: Recent Labs    06/18/22 2141  VITAMINB12 462  FERRITIN 150    Recent Results (from the past 240 hour(s))  Resp Panel by RT-PCR (Flu A&B, Covid) Anterior Nasal Swab     Status: Abnormal   Collection Time: 06/18/22  4:04 PM   Specimen: Anterior Nasal Swab  Result Value Ref Range Status   SARS Coronavirus 2 by RT PCR POSITIVE (A) NEGATIVE Final    Comment: (NOTE) SARS-CoV-2 target nucleic acids are  DETECTED.  The SARS-CoV-2 RNA is generally detectable in upper respiratory specimens during the acute phase of infection. Positive results are indicative of the presence of the identified virus, but do not rule out bacterial infection or co-infection with other pathogens not detected by the test. Clinical correlation with patient history and other diagnostic information is necessary to determine patient infection status. The expected result is Negative.  Fact Sheet for Patients: EntrepreneurPulse.com.au  Fact Sheet for Healthcare Providers: IncredibleEmployment.be  This test is not yet approved or cleared by the [place] FDA and  has been authorized for detection and/or diagnosis of SARS-CoV-2 by FDA under an Emergency Use Authorization (EUA).  This EUA will remain in effect (meaning this test can be used) for the duration of  the COVID-19 declaration under Section 564(b)(1) of the A ct, 21 U.S.C. section 360bbb-3(b)(1), unless the authorization is terminated or revoked sooner.     Influenza A by PCR NEGATIVE NEGATIVE Final   Influenza B by PCR NEGATIVE NEGATIVE Final    Comment: (NOTE) The Xpert Xpress SARS-CoV-2/FLU/RSV plus assay is intended as an aid in the diagnosis of influenza from Nasopharyngeal swab specimens and should not be used as a sole basis for treatment. Nasal washings and aspirates are unacceptable for Xpert Xpress SARS-CoV-2/FLU/RSV testing.  Fact Sheet for Patients: EntrepreneurPulse.com.au  Fact Sheet for Healthcare Providers: IncredibleEmployment.be  This test is not yet approved or cleared by the [place] FDA and has been authorized for detection and/or diagnosis of SARS-CoV-2 by FDA under an Emergency Use Authorization (EUA). This EUA will remain in effect (meaning this test can be used) for the duration of the COVID-19 declaration under Section 564(b)(1) of the Act, 21  U.S.C. section 360bbb-3(b)(1), unless the authorization is terminated or revoked.  Performed at [facility], [place] [address], [place], [place] [address]       Radiology Studies: CT Head Wo Contrast  Result Date: 06/18/2022 CLINICAL DATA:  Mental status EXAM: CT HEAD WITHOUT CONTRAST TECHNIQUE: Contiguous axial images were obtained from the base of the skull through the vertex without intravenous contrast. RADIATION DOSE REDUCTION: This exam was performed according to the departmental dose-optimization program which includes automated exposure control, adjustment of the mA and/or kV according to patient size and/or use of iterative reconstruction technique. COMPARISON:  CT head 1 day prior FINDINGS: Brain: There is no acute intracranial hemorrhage, extra-axial fluid collection, or acute infarct. Parenchymal volume is normal. The ventricles are normal in size. Gray-white differentiation is preserved. There is no mass lesion.  There is no mass effect or midline shift. Vascular: No hyperdense vessel or unexpected calcification. Skull: Normal. Negative for fracture or focal lesion. Sinuses/Orbits: There is a small amount of layering fluid in the bilateral maxillary sinuses. Bilateral lens implants are in place. The globes and orbits are otherwise unremarkable. Other: A presumed stimulator device is again seen overlying the frontal bone. IMPRESSION: Stable head CT with no acute intracranial pathology. Electronically Signed   By: Valetta Mole M.D.   On: 06/18/2022 16:34   DG Chest Portable 1 View  Result Date: 06/18/2022 CLINICAL DATA:  Altered mental status.  COVID positive. EXAM: PORTABLE CHEST 1 VIEW COMPARISON:  06/19/2019 FINDINGS: Moderate right hemidiaphragm elevation is chronic. Battery for brain stimulator projects over the left chest. Midline trachea. Normal heart size for level of inspiration. Atherosclerosis in the transverse aorta. No pleural effusion or pneumothorax. Right  greater than left base scarring and/or atelectasis, relatively similar. No lobar consolidation. IMPRESSION: No acute cardiopulmonary disease. Moderate right hemidiaphragm elevation with similar bibasilar scarring/atelectasis. Aortic Atherosclerosis (ICD10-I70.0). Electronically Signed   By: Abigail Miyamoto M.D.   On: 06/18/2022 16:05       LOS: 0 days   [facility] Pager on [email or web address]  06/19/2022, 11:42 AM

## 2022-06-20 DIAGNOSIS — G40909 Epilepsy, unspecified, not intractable, without status epilepticus: Secondary | ICD-10-CM | POA: Diagnosis present

## 2022-06-20 DIAGNOSIS — G9349 Other encephalopathy: Secondary | ICD-10-CM | POA: Diagnosis not present

## 2022-06-20 DIAGNOSIS — Z87891 Personal history of nicotine dependence: Secondary | ICD-10-CM | POA: Diagnosis not present

## 2022-06-20 DIAGNOSIS — Z79890 Hormone replacement therapy (postmenopausal): Secondary | ICD-10-CM | POA: Diagnosis not present

## 2022-06-20 DIAGNOSIS — F05 Delirium due to known physiological condition: Secondary | ICD-10-CM | POA: Diagnosis not present

## 2022-06-20 DIAGNOSIS — E872 Acidosis, unspecified: Secondary | ICD-10-CM | POA: Diagnosis present

## 2022-06-20 DIAGNOSIS — Z8042 Family history of malignant neoplasm of prostate: Secondary | ICD-10-CM | POA: Diagnosis not present

## 2022-06-20 DIAGNOSIS — F1010 Alcohol abuse, uncomplicated: Secondary | ICD-10-CM | POA: Diagnosis present

## 2022-06-20 DIAGNOSIS — F03A3 Unspecified dementia, mild, with mood disturbance: Secondary | ICD-10-CM | POA: Diagnosis present

## 2022-06-20 DIAGNOSIS — D696 Thrombocytopenia, unspecified: Secondary | ICD-10-CM | POA: Diagnosis present

## 2022-06-20 DIAGNOSIS — Z79899 Other long term (current) drug therapy: Secondary | ICD-10-CM | POA: Diagnosis not present

## 2022-06-20 DIAGNOSIS — Z8546 Personal history of malignant neoplasm of prostate: Secondary | ICD-10-CM | POA: Diagnosis not present

## 2022-06-20 DIAGNOSIS — G9341 Metabolic encephalopathy: Secondary | ICD-10-CM | POA: Diagnosis present

## 2022-06-20 DIAGNOSIS — Z823 Family history of stroke: Secondary | ICD-10-CM | POA: Diagnosis not present

## 2022-06-20 DIAGNOSIS — R339 Retention of urine, unspecified: Secondary | ICD-10-CM | POA: Diagnosis present

## 2022-06-20 DIAGNOSIS — E876 Hypokalemia: Secondary | ICD-10-CM | POA: Diagnosis present

## 2022-06-20 DIAGNOSIS — Z8249 Family history of ischemic heart disease and other diseases of the circulatory system: Secondary | ICD-10-CM | POA: Diagnosis not present

## 2022-06-20 DIAGNOSIS — E785 Hyperlipidemia, unspecified: Secondary | ICD-10-CM | POA: Diagnosis present

## 2022-06-20 DIAGNOSIS — U071 COVID-19: Secondary | ICD-10-CM | POA: Diagnosis present

## 2022-06-20 DIAGNOSIS — Z85828 Personal history of other malignant neoplasm of skin: Secondary | ICD-10-CM | POA: Diagnosis not present

## 2022-06-20 DIAGNOSIS — E039 Hypothyroidism, unspecified: Secondary | ICD-10-CM | POA: Diagnosis present

## 2022-06-20 DIAGNOSIS — R4182 Altered mental status, unspecified: Secondary | ICD-10-CM | POA: Diagnosis present

## 2022-06-20 LAB — BASIC METABOLIC PANEL
Anion gap: 7 (ref 5–15)
BUN: 10 mg/dL (ref 8–23)
CO2: 24 mmol/L (ref 22–32)
Calcium: 8.3 mg/dL — ABNORMAL LOW (ref 8.9–10.3)
Chloride: 105 mmol/L (ref 98–111)
Creatinine, Ser: 0.91 mg/dL (ref 0.61–1.24)
GFR, Estimated: >60 mL/min (ref >60)
Glucose, Bld: 97 mg/dL (ref 70–99)
Potassium: 3.9 mmol/L (ref 3.5–5.1)
Sodium: 136 mmol/L (ref 135–145)

## 2022-06-20 MED ORDER — HALOPERIDOL LACTATE 5 MG/ML IJ SOLN
1.0000 mg | Freq: Four times a day (QID) | INTRAMUSCULAR | Status: DC | PRN
  Administered 2022-06-20 – 2022-06-22 (×3): 1 mg via INTRAVENOUS
  Filled 2022-06-20 (×4): qty 1

## 2022-06-20 MED ORDER — TAMSULOSIN HCL 0.4 MG PO CAPS
0.4000 mg | ORAL_CAPSULE | Freq: Every day | ORAL | Status: DC
  Administered 2022-06-20 – 2022-06-26 (×7): 0.4 mg via ORAL
  Filled 2022-06-20 (×7): qty 1

## 2022-06-20 NOTE — Progress Notes (Signed)
PT Cancellation Note  Patient Details Name: Robert Cummings. MRN: [id number] DOB: 01/16/1946   Cancelled Treatment:    Reason Eval/Treat Not Completed: PT screened, no needs identified, will sign off Nursing staff report pt is able to mobilize around room however does not follow commands (had incident of bowel incontinence while ambulating in room with nursing).  Pt with dementia however also admitted for acute encephalopathy and Covid 19 positive.  Since pt not following commands at this time, not appropriate for skilled PT intervention however pt appears able to mobilize with nursing staff so recommend staff continue to assist pt with mobility during acute stay.     Kati L Payson 06/20/2022, 11:22 AM Arlyce Dice, DPT Physical Therapist [facility] Preferred contact method: Secure Chat Weekend Pager Only: [phone] Office: [phone]

## 2022-06-20 NOTE — Progress Notes (Signed)
[facility] PROGRESS NOTE   Robert D Wickens Jr. YHC:[id number] DOB: 02/01/1946 DOA: 06/18/2022  PCP: Shelly Coss, MD  Brief History/Interval Summary: 76 y.o. male with medical history significant of chronic alcohol abuse, mild to moderate dementia, history of concussion/TBI in 2000, seizure disorder, depression, chronic thrombocytopenia, hyperlipidemia, hypothyroidism presented to the ED from his assisted living facility for evaluation of altered mental status/confusion.  Patient tested positive for COVID 2 days prior to admission at his facility.  Staff reported that patient is a daily alcohol user but has not had alcohol in a few days.    Consultants: None  Procedures: None    Subjective/Interval History: Patient was fast asleep.  Was noted to be mildly confused when he woke up but then was able to answer questions better today than yesterday.  He was able to tell me that he was in [place] and in the hospital.  He was able to tell me the ER.  Overnight he developed urinary retention requiring Foley catheter placement.    Assessment/Plan:  Acute metabolic encephalopathy Thought to be triggered by COVID-19 infection.  CT head was negative for acute findings.  No focal neurological deficits on examination. TSH noted to be normal.  B12 level 462.  UA was unremarkable.  Urine drug screen unremarkable.  Alcohol level less than 10. Mentation is stable.  Patient is followed by neurology for her seizure disorder.  Neurology notes reviewed.  Patient also apparently has a history of dementia.  Memantine was being considered but not initiated yet. Continue to reorient daily.  Mobilize.  COVID-19 infection Chest x-ray did not show any acute findings.  He is not hypoxic.  Does not have any respiratory symptoms.  CRP is only 2.9.  Started on Paxlovid.  No indication for steroids at this time.  Urinary retention Foley catheter had to be placed overnight.  Will initiate Flomax.  We  will mobilize him and attempt voiding trial later today.  Hypokalemia Repleted.  Magnesium was 1.9.  Mild normal anion gap metabolic acidosis Resolved.  Chronic alcohol abuse No evidence of withdrawal.  Continue multivitamin thiamine folate.  Seizure disorder Continue Keppra.  Depression Continue with home medications.  Chronic thrombocytopenia Stable.  Hyperlipidemia Continue statin.  Hypothyroidism TSH normal.  Continue levothyroxine.   DVT Prophylaxis: Lovenox Code Status: Full code Family Communication: Discussed with patient Disposition Plan: Hopefully return back to his assisted living facility in 24 to 48 hours  Status is: Observation The patient will require care spanning > 2 midnights and should be moved to inpatient because: Continued altered mental status      Medications: Scheduled:  brimonidine  1 drop Both Eyes BID   enoxaparin (LOVENOX) injection  40 mg Subcutaneous QHS   folic acid  1 mg Oral Daily   levETIRAcetam  500 mg Oral BID   levothyroxine  37.5 mcg Oral Q0600   loratadine  10 mg Oral Daily   montelukast  10 mg Oral QHS   multivitamin with minerals  1 tablet Oral Daily   nirmatrelvir/ritonavir EUA  3 tablet Oral BID   tamsulosin  0.4 mg Oral Daily   thiamine  100 mg Oral Daily   Or   thiamine  100 mg Intravenous Daily   timolol  1 drop Both Eyes BID   traZODone  25 mg Oral QHS   valACYclovir  500 mg Oral q AM   Continuous: [id number]:[id number], LORazepam **OR** LORazepam  Antibiotics: Anti-infectives (From admission, onward)    Start  Dose/Rate Route Frequency Ordered Stop   06/20/22 0700  valACYclovir (VALTREX) tablet 500 mg        500 mg Oral Every morning 06/19/22 1141     06/18/22 2230  nirmatrelvir/ritonavir EUA (PAXLOVID) 3 tablet        3 tablet Oral 2 times daily 06/18/22 2102 06/23/22 2159       Objective:  Vital Signs  Vitals:   06/19/22 1715 06/19/22 1815 06/19/22 1955 06/20/22 0637  BP:   (!) 150/92 (!)  150/101  Pulse: 77 76 78 77  Resp:   20 19  Temp:   98.9 F (37.2 C) 98.5 F (36.9 C)  TempSrc:   Oral Oral  SpO2:   90% 92%  Weight:      Height:        Intake/Output Summary (Last 24 hours) at 06/20/2022 1132 Last data filed at 06/20/2022 0100 Gross per 24 hour  Intake --  Output 2050 ml  Net -2050 ml    Filed Weights   06/18/22 1532 06/18/22 2232  Weight: 88.5 kg 87.2 kg    General appearance: Awake alert.  In no distress.  Mildly distracted Resp: Clear to auscultation bilaterally.  Normal effort Cardio: S1-S2 is normal regular.  No S3-S4.  No rubs murmurs or bruit GI: Abdomen is soft.  Nontender nondistended.  Bowel sounds are present normal.  No masses organomegaly Extremities: No edema.  Full range of motion of lower extremities.   Lab Results:  Data Reviewed: I have personally reviewed following labs and reports of the imaging studies  CBC: Recent Labs  Lab 06/18/22 1604 06/19/22 0418  WBC 4.3 4.9  NEUTROABS 2.1 2.5  HGB 14.5 13.5  HCT 41.2 39.6  MCV 91.4 95.0  PLT 133* 123*     Basic Metabolic Panel: Recent Labs  Lab 06/18/22 1604 06/19/22 0409 06/19/22 0418 06/20/22 0431  NA 136  --  137 136  K 3.9  --  3.3* 3.9  CL 104  --  104 105  CO2 19*  --  25 24  GLUCOSE 97  --  98 97  BUN 11  --  13 10  CREATININE 0.96  --  0.91 0.91  CALCIUM 8.9  --  8.3* 8.3*  MG  --  1.9  --   --      GFR: Estimated Creatinine Clearance: 75.9 mL/min (by C-G formula based on SCr of 0.91 mg/dL).  Liver Function Tests: Recent Labs  Lab 06/18/22 1604 06/19/22 0418  AST 37 34  ALT 30 28  ALKPHOS 52 51  BILITOT 1.2 0.8  PROT 7.4 6.5  ALBUMIN 4.3 3.8     Recent Labs  Lab 06/18/22 1604  AMMONIA 21       CBG: Recent Labs  Lab 06/18/22 1517  GLUCAP 101*     Thyroid Function Tests: Recent Labs    06/18/22 2141  TSH 3.654     Anemia Panel: Recent Labs    06/18/22 2141  VITAMINB12 462  FERRITIN 150     Recent Results (from the  past 240 hour(s))  Resp Panel by RT-PCR (Flu A&B, Covid) Anterior Nasal Swab     Status: Abnormal   Collection Time: 06/18/22  4:04 PM   Specimen: Anterior Nasal Swab  Result Value Ref Range Status   SARS Coronavirus 2 by RT PCR POSITIVE (A) NEGATIVE Final    Comment: (NOTE) SARS-CoV-2 target nucleic acids are DETECTED.  The SARS-CoV-2 RNA is generally detectable in upper respiratory  specimens during the acute phase of infection. Positive results are indicative of the presence of the identified virus, but do not rule out bacterial infection or co-infection with other pathogens not detected by the test. Clinical correlation with patient history and other diagnostic information is necessary to determine patient infection status. The expected result is Negative.  Fact Sheet for Patients: EntrepreneurPulse.com.au  Fact Sheet for Healthcare Providers: IncredibleEmployment.be  This test is not yet approved or cleared by the [place] FDA and  has been authorized for detection and/or diagnosis of SARS-CoV-2 by FDA under an Emergency Use Authorization (EUA).  This EUA will remain in effect (meaning this test can be used) for the duration of  the COVID-19 declaration under Section 564(b)(1) of the A ct, 21 U.S.C. section 360bbb-3(b)(1), unless the authorization is terminated or revoked sooner.     Influenza A by PCR NEGATIVE NEGATIVE Final   Influenza B by PCR NEGATIVE NEGATIVE Final    Comment: (NOTE) The Xpert Xpress SARS-CoV-2/FLU/RSV plus assay is intended as an aid in the diagnosis of influenza from Nasopharyngeal swab specimens and should not be used as a sole basis for treatment. Nasal washings and aspirates are unacceptable for Xpert Xpress SARS-CoV-2/FLU/RSV testing.  Fact Sheet for Patients: EntrepreneurPulse.com.au  Fact Sheet for Healthcare Providers: IncredibleEmployment.be  This test is not  yet approved or cleared by the [place] FDA and has been authorized for detection and/or diagnosis of SARS-CoV-2 by FDA under an Emergency Use Authorization (EUA). This EUA will remain in effect (meaning this test can be used) for the duration of the COVID-19 declaration under Section 564(b)(1) of the Act, 21 U.S.C. section 360bbb-3(b)(1), unless the authorization is terminated or revoked.  Performed at [facility], [place] [address] [place], [place] [address]       Radiology Studies: CT Head Wo Contrast  Result Date: 06/18/2022 CLINICAL DATA:  Mental status EXAM: CT HEAD WITHOUT CONTRAST TECHNIQUE: Contiguous axial images were obtained from the base of the skull through the vertex without intravenous contrast. RADIATION DOSE REDUCTION: This exam was performed according to the departmental dose-optimization program which includes automated exposure control, adjustment of the mA and/or kV according to patient size and/or use of iterative reconstruction technique. COMPARISON:  CT head 1 day prior FINDINGS: Brain: There is no acute intracranial hemorrhage, extra-axial fluid collection, or acute infarct. Parenchymal volume is normal. The ventricles are normal in size. Gray-white differentiation is preserved. There is no mass lesion.  There is no mass effect or midline shift. Vascular: No hyperdense vessel or unexpected calcification. Skull: Normal. Negative for fracture or focal lesion. Sinuses/Orbits: There is a small amount of layering fluid in the bilateral maxillary sinuses. Bilateral lens implants are in place. The globes and orbits are otherwise unremarkable. Other: A presumed stimulator device is again seen overlying the frontal bone. IMPRESSION: Stable head CT with no acute intracranial pathology. Electronically Signed   By: Valetta Mole M.D.   On: 06/18/2022 16:34   DG Chest Portable 1 View  Result Date: 06/18/2022 CLINICAL DATA:  Altered mental status.  COVID  positive. EXAM: PORTABLE CHEST 1 VIEW COMPARISON:  06/19/2019 FINDINGS: Moderate right hemidiaphragm elevation is chronic. Battery for brain stimulator projects over the left chest. Midline trachea. Normal heart size for level of inspiration. Atherosclerosis in the transverse aorta. No pleural effusion or pneumothorax. Right greater than left base scarring and/or atelectasis, relatively similar. No lobar consolidation. IMPRESSION: No acute cardiopulmonary disease. Moderate right hemidiaphragm elevation with similar bibasilar scarring/atelectasis. Aortic  Atherosclerosis (ICD10-I70.0). Electronically Signed   By: Abigail Miyamoto M.D.   On: 06/18/2022 16:05       LOS: 0 days   [facility] Pager on [email or web address]  06/20/2022, 11:32 AM

## 2022-06-20 NOTE — TOC Initial Note (Signed)
Transition of Care (TOC) - Initial/Assessment Note    Patient Details  Name: Robert Cummings. MRN: [id number] Date of Birth: October 10, 1946  [facility] CM/SW Contact:    Roseanne Kaufman, RN Phone Number: 06/20/2022, 4:20 PM  Clinical Narrative:    substance abuse resources attached to d/c AVS.               TOC will continue to follow.  Expected Discharge Plan: Assisted Living Barriers to Discharge: Continued Medical Work up   Patient Goals and CMS Choice        Expected Discharge Plan and Services Expected Discharge Plan: Assisted Living                                              Prior Living Arrangements/Services                       Activities of Daily Living Home Assistive Devices/Equipment: Other (Comment) (pt unable to verbalize any assistive equipment that he uses) ADL Screening (condition at time of admission) Patient's cognitive ability adequate to safely complete daily activities?: No Is the patient deaf or have difficulty hearing?: No Does the patient have difficulty seeing, even when wearing glasses/contacts?: No Does the patient have difficulty concentrating, remembering, or making decisions?: Yes Patient able to express need for assistance with ADLs?: Yes Does the patient have difficulty dressing or bathing?: No Independently performs ADLs?: Yes (appropriate for developmental age) Does the patient have difficulty walking or climbing stairs?: No Weakness of Legs: None Weakness of Arms/Hands: None  Permission Sought/Granted                  Emotional Assessment           Psych Involvement: No (comment)  Admission diagnosis:  Altered mental status, unspecified altered mental status type [R41.82] COVID-19 virus infection [U07.1] Encephalopathy due to COVID-19 virus [U07.1, P50.93] Acute metabolic encephalopathy [O67.12] Patient Active Problem List   Diagnosis Date Noted   COVID-19 virus infection  45/80/9983   Acute metabolic encephalopathy 38/25/0539   Metabolic acidosis 76/73/4193   Chronic alcohol abuse 06/18/2022   Depression 06/18/2022   Thrombocytopenia ([place]) 06/18/2022   Hyperlipidemia 06/18/2022   Hypothyroidism 06/18/2022   Seizure disorder ([place]) 06/18/2019   PCP:  Shelly Coss, MD Pharmacy:   [facility], [place] [address] [place] [address] Phone: [phone] Fax: [phone]     Social Determinants of Health (SDOH) Interventions    Readmission Risk Interventions     No data to display

## 2022-06-20 NOTE — Evaluation (Signed)
Occupational Therapy Evaluation Patient Details Name: Robert Cummings. MRN: [id number] DOB: 1946-07-13 Today's Date: 06/20/2022   History of Present Illness Pt is a 76 year old male presenting with acute metabolic encephalopathy, AMS and COVID. EEG was nonspecific, but showed mild diffuse metabolic encephalopathy. Pt was intubated in ER on 8/15. Pt self extubated on 8/17. Relevant PMH includes HLD, hypothyroidism, depression,  seizure, acute metabolic encephalopathy, and hx of TBI.   Clinical Impression   Patient is currently requiring assistance with ADLs including Minimal assist with Lower body ADLs, up to min guard assist with Upper body ADLs,  as well as supervision with bed mobility and up to Moderate assist with functional transfers to toilet especially with descent.  Current level of function is below patient's typical baseline especially cognitively per pt's brother who reported this to pt's RN.   During this evaluation, patient was limited by generalized weakness, impaired activity tolerance, and impaired cognition, all of which has the potential to impact patient's safety and independence during functional mobility, as well as performance for ADLs.  Patient lives at an ALF, which provides PRN supervision and assistance.  Patient demonstrates fair rehab potential, and should benefit from continued skilled occupational therapy services while in acute care to maximize safety, independence and quality of life at home.  ?    Recommendations for follow up therapy are one component of a multi-disciplinary discharge planning process, led by the attending physician.  Recommendations may be updated based on patient status, additional functional criteria and insurance authorization.   Follow Up Recommendations  No OT follow up    Assistance Recommended at Discharge Frequent or constant Supervision/Assistance  Patient can return home with the following A little help with walking and/or  transfers;A little help with bathing/dressing/bathroom;Assistance with cooking/housework;Direct supervision/assist for financial management;Assist for transportation;Direct supervision/assist for medications management    Functional Status Assessment  Patient has had a recent decline in their functional status and demonstrates the ability to make significant improvements in function in a reasonable and predictable amount of time.  Equipment Recommendations  None recommended by OT    Recommendations for Other Services       Precautions / Restrictions Precautions Precautions: Fall Precaution Comments: Airborn/Contact Restrictions Weight Bearing Restrictions: No      Mobility Bed Mobility Overal bed mobility: Needs Assistance Bed Mobility: Supine to Sit     Supine to sit: Supervision, HOB elevated     General bed mobility comments: Increased time, cues for sequencing. Use of bed rails.    Transfers                          Balance Overall balance assessment: History of Falls, Needs assistance   Sitting balance-Leahy Scale: Good     Standing balance support: Single extremity supported, During functional activity Standing balance-Leahy Scale: Fair (But impulsive)                             ADL either performed or assessed with clinical judgement   ADL Overall ADL's : Needs assistance/impaired Eating/Feeding: Set up;Sitting;Supervision/ safety;Cueing for sequencing Eating/Feeding Details (indicate cue type and reason): Pt shown feeding utensils. Pt then stalled on initiating eating despite verbal cues. Pt then asked where feeding utensils were. Grooming: Standing;Wash/dry face;Oral care;Wash/dry hands;Min guard   Upper Body Bathing: Set up;Supervision/ safety;Sitting   Lower Body Bathing: Minimal assistance;Sitting/lateral leans;Sit to/from stand   Upper Body Dressing : Set  up;Supervision/safety;Sitting   Lower Body Dressing: Minimal  assistance;Sitting/lateral leans;Sit to/from stand   Toilet Transfer: Min guard;Ambulation;Rolling walker (2 wheels);Moderate assistance Toilet Transfer Details (indicate cue type and reason): Pt stood from EOB with Min guard assist, and used RW to ambulate ~5' to sink. Pt then pivoted to recliner, releasing RW and sitting very impulsively and with poor eccentric control and a hard sit with Mod As to control descent. Toileting- Clothing Manipulation and Hygiene: Minimal assistance;Sitting/lateral lean;Sit to/from stand       Functional mobility during ADLs: Min guard;Rolling walker (2 wheels) General ADL Comments: Moderate assist for stand to sit.     Vision   Vision Assessment?: No apparent visual deficits     Perception     Praxis      Pertinent Vitals/Pain Pain Assessment Pain Assessment: No/denies pain     Hand Dominance Right   Extremity/Trunk Assessment Upper Extremity Assessment Upper Extremity Assessment: Overall WFL for tasks assessed   Lower Extremity Assessment Lower Extremity Assessment: Defer to PT evaluation   Cervical / Trunk Assessment Cervical / Trunk Assessment: Normal   Communication Communication Communication: No difficulties   Cognition Arousal/Alertness: Awake/alert Behavior During Therapy: WFL for tasks assessed/performed Overall Cognitive Status: Impaired/Different from baseline Area of Impairment: Orientation, Memory, Attention, Awareness                 Orientation Level: Disoriented to, Time, Situation Current Attention Level: Selective Memory: Decreased short-term memory     Awareness: Emergent   General Comments: No family present. Pt pleasantly confused. Disoriented to time and situation. Per RN, Pt's brother stated that pt's cognition is altered and pt is typically not as confused as he currently is.     General Comments       Exercises     Shoulder Instructions      Home Living Family/patient expects to be  discharged to:: Assisted living                             Home Equipment: Kasandra Knudsen - single point   Additional Comments: Pt not a reliable historian, so uncertain of credibility of information. Reports he lives at "[facility] in [place].      Prior Functioning/Environment Prior Level of Function : History of Falls (last six months)             Mobility Comments: Reports Ambulating without AD. Endorses multiple falls. ADLs Comments: Pt reports Independence with BADLS.        OT Problem List: Decreased activity tolerance;Impaired balance (sitting and/or standing);Decreased safety awareness;Decreased cognition      OT Treatment/Interventions: Self-care/ADL training;Therapeutic activities;Patient/family education;DME and/or AE instruction;Cognitive remediation/compensation;Balance training    OT Goals(Current goals can be found in the care plan section) Acute Rehab OT Goals OT Goal Formulation: Patient unable to participate in goal setting Time For Goal Achievement: 07/04/22 Potential to Achieve Goals: Fair ADL Goals Pt Will Perform Lower Body Dressing: with modified independence;sitting/lateral leans;sit to/from stand Pt Will Transfer to Toilet: with modified independence;ambulating (WITH CONTROLLED DESCENTS) Pt Will Perform Toileting - Clothing Manipulation and hygiene: with modified independence;sit to/from stand;sitting/lateral leans Additional ADL Goal #1: Pt will engage in at least 20 min functional activities without loss of sitting or standing balance, in order to demonstrate improved activity tolerance and balance needed to perform ADLs safely at home. Additional ADL Goal #2: Pt will be able to identify at least 3 ways to prevent falls and identify common  fall hazards.  OT Frequency: Min 2X/week    Co-evaluation              AM-PAC OT "6 Clicks" Daily Activity     Outcome Measure Help from another person eating meals?: A Little Help from another  person taking care of personal grooming?: A Little Help from another person toileting, which includes using toliet, bedpan, or urinal?: A Little Help from another person bathing (including washing, rinsing, drying)?: A Little Help from another person to put on and taking off regular upper body clothing?: A Little Help from another person to put on and taking off regular lower body clothing?: A Little 6 Click Score: 18   End of Session Equipment Utilized During Treatment: Gait belt;Rolling walker (2 wheels) Nurse Communication: Mobility status (Decreased safety with stand to sit transition)  Activity Tolerance: Patient tolerated treatment well Patient left: in chair;with call bell/phone within reach;with chair alarm set  OT Visit Diagnosis: Unsteadiness on feet (R26.81);Repeated falls (R29.6);History of falling (Z91.81);Other symptoms and signs involving cognitive function                Time: 6967-8938 OT Time Calculation (min): 28 min Charges:  OT General Charges $OT Visit: 1 Visit OT Evaluation $OT Eval Low Complexity: 1 Low OT Treatments $Self Care/Home Management : 8-22 mins  Anderson Malta, OT Acute Rehab Services Office: [phone] 06/20/2022  Robert Cummings 06/20/2022, 12:09 PM

## 2022-06-21 DIAGNOSIS — G40909 Epilepsy, unspecified, not intractable, without status epilepticus: Secondary | ICD-10-CM | POA: Diagnosis not present

## 2022-06-21 DIAGNOSIS — G9341 Metabolic encephalopathy: Secondary | ICD-10-CM | POA: Diagnosis not present

## 2022-06-21 DIAGNOSIS — U071 COVID-19: Secondary | ICD-10-CM | POA: Diagnosis not present

## 2022-06-21 DIAGNOSIS — E039 Hypothyroidism, unspecified: Secondary | ICD-10-CM

## 2022-06-21 MED ORDER — THIAMINE HCL 100 MG PO TABS: 100.0000 mg | ORAL_TABLET | Freq: Every day | ORAL | 1 refills | Status: AC

## 2022-06-21 MED ORDER — ATORVASTATIN CALCIUM 20 MG PO TABS: 20.0000 mg | ORAL_TABLET | Freq: Every day | ORAL | Status: AC

## 2022-06-21 MED ORDER — TAMSULOSIN HCL 0.4 MG PO CAPS: 0.4000 mg | ORAL_CAPSULE | Freq: Every day | ORAL | 1 refills | Status: AC

## 2022-06-21 MED ORDER — FOLIC ACID 1 MG PO TABS: 1.0000 mg | ORAL_TABLET | Freq: Every day | ORAL | 1 refills | Status: AC

## 2022-06-21 MED ORDER — SERTRALINE HCL 50 MG PO TABS: 100.0000 mg | ORAL_TABLET | Freq: Every morning | ORAL | Status: AC

## 2022-06-21 MED ORDER — NIRMATRELVIR/RITONAVIR (PAXLOVID)TABLET: 3.0000 | ORAL_TABLET | Freq: Two times a day (BID) | ORAL | 0 refills | Status: DC

## 2022-06-21 NOTE — Progress Notes (Signed)
Spoke with patient's brother Denice Paradise [phone]) who offered to coordinate with [facility] for transporting patient back to facility.  Social worker notified.  Angie Fava, RN

## 2022-06-21 NOTE — Discharge Summary (Deleted)
[facility]  Physician Discharge Summary   Patient ID: Robert Cummings. MRN: [id number] DOB/AGE: [birth year] 76 y.o.  Admit date: 06/18/2022 Discharge date:   06/21/2022   PCP: Shelly Coss, MD  DISCHARGE DIAGNOSES:  Principal Problem:   COVID-19 virus infection Active Problems:   Seizure disorder ([place])   Acute metabolic encephalopathy   Metabolic acidosis   Chronic alcohol abuse   Depression   Thrombocytopenia ([facility])   Hyperlipidemia   Hypothyroidism   RECOMMENDATIONS FOR OUTPATIENT FOLLOW UP: Paxlovid to be taken till Monday morning. Zoloft and statin medication can be resumed on Tuesday. Consider referral to urology if patient has problems urinating.    Home Health: None Equipment/Devices: None  CODE STATUS: Full code  DISCHARGE CONDITION: fair  Diet recommendation: As before  INITIAL HISTORY: 76 y.o. male with medical history significant of chronic alcohol abuse, mild to moderate dementia, history of concussion/TBI in 2000, seizure disorder, depression, chronic thrombocytopenia, hyperlipidemia, hypothyroidism presented to the ED from his assisted living facility for evaluation of altered mental status/confusion.  Patient tested positive for COVID 2 days prior to admission at his facility.  Staff reported that patient is a daily alcohol user but has not had alcohol in a few days.      HOSPITAL COURSE:   Acute metabolic encephalopathy Thought to be triggered by COVID-19 infection.  CT head was negative for acute findings.  No focal neurological deficits on examination. TSH noted to be normal.  B12 level 462.  UA was unremarkable.  Urine drug screen unremarkable.  Alcohol level less than 10. Mentation is stable.  Patient is followed by neurology for her seizure disorder.  Neurology notes reviewed.  Patient also apparently has a history of dementia.  Memantine was being considered but not initiated yet. Status stable and seems to be close to  baseline.   COVID-19 infection Chest x-ray did not show any acute findings.  He is not hypoxic.  Does not have any respiratory symptoms.  CRP is only 2.9.  Started on Paxlovid.  There was no indication to initiate steroids.   Urinary retention Foley catheter had to be placed overnight on 8/17.  Started on Flomax.  Foley catheter discontinued yesterday.  Was able to void just a few minutes ago.  Will be continued on Flomax.  May benefit from referral to urology if he continues to have difficulty urinating.     Hypokalemia Repleted.  Magnesium was 1.9.   Mild normal anion gap metabolic acidosis Resolved.   Chronic alcohol abuse No evidence of withdrawal.  Continue multivitamin thiamine folate.   Seizure disorder Continue Keppra.   Depression Continue with home medications.   Chronic thrombocytopenia Stable.   Hyperlipidemia Continue statin.   Hypothyroidism TSH normal.  Continue levothyroxine.    Patient is stable.  Okay for discharge back to his independent living facility.   PERTINENT LABS:  The results of significant diagnostics from this hospitalization (including imaging, microbiology, ancillary and laboratory) are listed below for reference.    Microbiology: Recent Results (from the past 240 hour(s))  Resp Panel by RT-PCR (Flu A&B, Covid) Anterior Nasal Swab     Status: Abnormal   Collection Time: 06/18/22  4:04 PM   Specimen: Anterior Nasal Swab  Result Value Ref Range Status   SARS Coronavirus 2 by RT PCR POSITIVE (A) NEGATIVE Final    Comment: (NOTE) SARS-CoV-2 target nucleic acids are DETECTED.  The SARS-CoV-2 RNA is generally detectable in upper respiratory specimens during the acute phase of infection.  Positive results are indicative of the presence of the identified virus, but do not rule out bacterial infection or co-infection with other pathogens not detected by the test. Clinical correlation with patient history and other diagnostic information is  necessary to determine patient infection status. The expected result is Negative.  Fact Sheet for Patients: EntrepreneurPulse.com.au  Fact Sheet for Healthcare Providers: IncredibleEmployment.be  This test is not yet approved or cleared by the [place] FDA and  has been authorized for detection and/or diagnosis of SARS-CoV-2 by FDA under an Emergency Use Authorization (EUA).  This EUA will remain in effect (meaning this test can be used) for the duration of  the COVID-19 declaration under Section 564(b)(1) of the A ct, 21 U.S.C. section 360bbb-3(b)(1), unless the authorization is terminated or revoked sooner.     Influenza A by PCR NEGATIVE NEGATIVE Final   Influenza B by PCR NEGATIVE NEGATIVE Final    Comment: (NOTE) The Xpert Xpress SARS-CoV-2/FLU/RSV plus assay is intended as an aid in the diagnosis of influenza from Nasopharyngeal swab specimens and should not be used as a sole basis for treatment. Nasal washings and aspirates are unacceptable for Xpert Xpress SARS-CoV-2/FLU/RSV testing.  Fact Sheet for Patients: EntrepreneurPulse.com.au  Fact Sheet for Healthcare Providers: IncredibleEmployment.be  This test is not yet approved or cleared by the [place] FDA and has been authorized for detection and/or diagnosis of SARS-CoV-2 by FDA under an Emergency Use Authorization (EUA). This EUA will remain in effect (meaning this test can be used) for the duration of the COVID-19 declaration under Section 564(b)(1) of the Act, 21 U.S.C. section 360bbb-3(b)(1), unless the authorization is terminated or revoked.  Performed at [facility], [place] [address], [place], [place] [address]      Labs:   Basic Metabolic Panel: Recent Labs  Lab 06/18/22 1604 06/19/22 0409 06/19/22 0418 06/20/22 0431  NA 136  --  137 136  K 3.9  --  3.3* 3.9  CL 104  --  104 105  CO2 19*  --  25  24  GLUCOSE 97  --  98 97  BUN 11  --  13 10  CREATININE 0.96  --  0.91 0.91  CALCIUM 8.9  --  8.3* 8.3*  MG  --  1.9  --   --    Liver Function Tests: Recent Labs  Lab 06/18/22 1604 06/19/22 0418  AST 37 34  ALT 30 28  ALKPHOS 52 51  BILITOT 1.2 0.8  PROT 7.4 6.5  ALBUMIN 4.3 3.8   Recent Labs  Lab 06/18/22 1604  AMMONIA 21   CBC: Recent Labs  Lab 06/18/22 1604 06/19/22 0418  WBC 4.3 4.9  NEUTROABS 2.1 2.5  HGB 14.5 13.5  HCT 41.2 39.6  MCV 91.4 95.0  PLT 133* 123*     CBG: Recent Labs  Lab 06/18/22 1517  GLUCAP 101*     IMAGING STUDIES CT Head Wo Contrast  Result Date: 06/18/2022 CLINICAL DATA:  Mental status EXAM: CT HEAD WITHOUT CONTRAST TECHNIQUE: Contiguous axial images were obtained from the base of the skull through the vertex without intravenous contrast. RADIATION DOSE REDUCTION: This exam was performed according to the departmental dose-optimization program which includes automated exposure control, adjustment of the mA and/or kV according to patient size and/or use of iterative reconstruction technique. COMPARISON:  CT head 1 day prior FINDINGS: Brain: There is no acute intracranial hemorrhage, extra-axial fluid collection, or acute infarct. Parenchymal volume is normal. The ventricles are normal  in size. Gray-white differentiation is preserved. There is no mass lesion.  There is no mass effect or midline shift. Vascular: No hyperdense vessel or unexpected calcification. Skull: Normal. Negative for fracture or focal lesion. Sinuses/Orbits: There is a small amount of layering fluid in the bilateral maxillary sinuses. Bilateral lens implants are in place. The globes and orbits are otherwise unremarkable. Other: A presumed stimulator device is again seen overlying the frontal bone. IMPRESSION: Stable head CT with no acute intracranial pathology. Electronically Signed   By: Valetta Mole M.D.   On: 06/18/2022 16:34   DG Chest Portable 1 View  Result Date:  06/18/2022 CLINICAL DATA:  Altered mental status.  COVID positive. EXAM: PORTABLE CHEST 1 VIEW COMPARISON:  06/19/2019 FINDINGS: Moderate right hemidiaphragm elevation is chronic. Battery for brain stimulator projects over the left chest. Midline trachea. Normal heart size for level of inspiration. Atherosclerosis in the transverse aorta. No pleural effusion or pneumothorax. Right greater than left base scarring and/or atelectasis, relatively similar. No lobar consolidation. IMPRESSION: No acute cardiopulmonary disease. Moderate right hemidiaphragm elevation with similar bibasilar scarring/atelectasis. Aortic Atherosclerosis (ICD10-I70.0). Electronically Signed   By: Abigail Miyamoto M.D.   On: 06/18/2022 16:05    DISCHARGE EXAMINATION: Vitals:   06/20/22 0637 06/20/22 1300 06/20/22 2202 06/21/22 0655  BP: (!) 150/101 135/86 116/69 134/87  Pulse: 77 90 96 74  Resp: '19 16 20 18  '$ Temp: 98.5 F (36.9 C) 98 F (36.7 C) 98.3 F (36.8 C) 98 F (36.7 C)  TempSrc: Oral  Oral Oral  SpO2: 92% 96% 90% 93%  Weight:      Height:       See progress note from earlier today  DISPOSITION: Independent living facility  Discharge Instructions     Call MD for:  difficulty breathing, headache or visual disturbances   Complete by: As directed    Call MD for:  extreme fatigue   Complete by: As directed    Call MD for:  persistant dizziness or light-headedness   Complete by: As directed    Call MD for:  persistant nausea and vomiting   Complete by: As directed    Call MD for:  severe uncontrolled pain   Complete by: As directed    Call MD for:  temperature >100.4   Complete by: As directed    Diet - low sodium heart healthy   Complete by: As directed    Discharge instructions   Complete by: As directed    Please take your medications as prescribed.  Seek attention if you have difficulty urinating going forward.  You may need to be referred to urology.  You were cared for by a hospitalist during your  hospital stay. If you have any questions about your discharge medications or the care you received while you were in the hospital after you are discharged, you can call the unit and asked to speak with the hospitalist on call if the hospitalist that took care of you is not available. Once you are discharged, your primary care physician will handle any further medical issues. Please note that NO REFILLS for any discharge medications will be authorized once you are discharged, as it is imperative that you return to your primary care physician (or establish a relationship with a primary care physician if you do not have one) for your aftercare needs so that they can reassess your need for medications and monitor your lab values. If you do not have a primary care physician, you can call  [phone] for a physician referral.   Increase activity slowly   Complete by: As directed          Allergies as of 06/21/2022       Reactions   Cephalexin Other (See Comments)   Brother cannot confirm   Ciprofloxacin Other (See Comments)   Brother cannot confirm   Codeine Other (See Comments)   Brother cannot confirm   Other Other (See Comments)   Ragweed- Runny nose, itchy eyes, and congestion also        Medication List     TAKE these medications    acetaminophen 325 MG tablet Commonly known as: TYLENOL Take 650 mg by mouth in the morning.   Astepro 0.15 % Soln Generic drug: Azelastine HCl Place 2 sprays into both nostrils in the morning and at bedtime.   atorvastatin 20 MG tablet Commonly known as: LIPITOR Take 1 tablet (20 mg total) by mouth at bedtime. Can be resumed after patient has completed course of Paxlovid Start taking on: June 24, 2022 What changed:  additional instructions These instructions start on June 24, 2022. If you are unsure what to do until then, ask your doctor or other care provider.   brimonidine 0.2 % ophthalmic solution Commonly known as: ALPHAGAN Place 1 drop  into both eyes 2 (two) times daily.   cetirizine 10 MG tablet Commonly known as: ZYRTEC Take 10 mg by mouth every evening.   folic acid 1 MG tablet Commonly known as: FOLVITE Take 1 tablet (1 mg total) by mouth daily. Start taking on: June 22, 2022   levETIRAcetam 500 MG tablet Commonly known as: KEPPRA Take 1 tablet (500 mg total) by mouth 2 (two) times daily. What changed: when to take this   levothyroxine 25 MCG tablet Commonly known as: SYNTHROID Take 37.5 mcg by mouth daily before breakfast.   loperamide 2 MG tablet Commonly known as: IMODIUM A-D Take 2 mg by mouth 4 (four) times daily as needed for diarrhea or loose stools.   montelukast 10 MG tablet Commonly known as: SINGULAIR Take 10 mg by mouth at bedtime.   multivitamin with minerals Tabs tablet Take 1 tablet by mouth daily with breakfast.   nirmatrelvir/ritonavir EUA 20 x 150 MG & 10 x '100MG'$  Tabs Commonly known as: PAXLOVID Take 3 tablets by mouth 2 (two) times daily for 3 days.   ondansetron 4 MG tablet Commonly known as: ZOFRAN Take 4 mg by mouth every 8 (eight) hours as needed for nausea or vomiting.   potassium chloride SA 20 MEQ tablet Commonly known as: KLOR-CON M Take 1 tablet (20 mEq total) by mouth daily. Take 40 mg (2 pills) daily  for 5 days then continue one pill daily What changed:  when to take this additional instructions   sertraline 50 MG tablet Commonly known as: ZOLOFT Take 2 tablets (100 mg total) by mouth in the morning. Can be resumed after patient has completed course of Paxlovid Start taking on: June 24, 2022 What changed:  additional instructions These instructions start on June 24, 2022. If you are unsure what to do until then, ask your doctor or other care provider.   tamsulosin 0.4 MG Caps capsule Commonly known as: FLOMAX Take 1 capsule (0.4 mg total) by mouth daily. Start taking on: June 22, 2022   thiamine 100 MG tablet Commonly known as: VITAMIN B1 Take 1  tablet (100 mg total) by mouth daily. Start taking on: June 22, 2022   timolol 0.5 % ophthalmic  solution Commonly known as: TIMOPTIC Place 1 drop into both eyes 2 (two) times daily.   traZODone 50 MG tablet Commonly known as: DESYREL Take 25 mg by mouth at bedtime.   valACYclovir 500 MG tablet Commonly known as: VALTREX Take 500 mg by mouth in the morning.           TOTAL DISCHARGE TIME: 35 minutes  Aveion Nguyen [employer] on [email or web address]  06/21/2022, 11:01 AM

## 2022-06-21 NOTE — Progress Notes (Signed)
Patient has no urine occurrences charted since admission.  No urine occurrences per nightshift RN.  Bladder scan at 0900 showed 410 cc urine.  MD notified.  Patient assisted OOB to chair per MD order, patient did not void.  MD notified, will continue to monitor.  Angie Fava, RN

## 2022-06-21 NOTE — Progress Notes (Signed)
[facility] PROGRESS NOTE   Robert D Narez Jr. NGE:[id number] DOB: Jun 03, 1946 DOA: 06/18/2022  PCP: Shelly Coss, MD  Brief History/Interval Summary: 76 y.o. male with medical history significant of chronic alcohol abuse, mild to moderate dementia, history of concussion/TBI in 2000, seizure disorder, depression, chronic thrombocytopenia, hyperlipidemia, hypothyroidism presented to the ED from his assisted living facility for evaluation of altered mental status/confusion.  Patient tested positive for COVID 2 days prior to admission at his facility.  Staff reported that patient is a daily alcohol user but has not had alcohol in a few days.    Consultants: None  Procedures: None    Subjective/Interval History: Patient remains distracted as before.  However he is more responsive to questions.  Answering appropriately.  Denies any complaints this morning.  Per nursing staff he has not voided since Foley catheter was removed last night.   Assessment/Plan:  Acute metabolic encephalopathy Thought to be triggered by COVID-19 infection.  CT head was negative for acute findings.  No focal neurological deficits on examination. TSH noted to be normal.  B12 level 462.  UA was unremarkable.  Urine drug screen unremarkable.  Alcohol level less than 10. Mentation is stable.  Patient is followed by neurology for her seizure disorder.  Neurology notes reviewed.  Patient also apparently has a history of dementia.  Memantine was being considered but not initiated yet. Status stable and seems to be close to baseline.  COVID-19 infection Chest x-ray did not show any acute findings.  He is not hypoxic.  Does not have any respiratory symptoms.  CRP is only 2.9.  Started on Paxlovid.  There was no indication to initiate steroids.  Urinary retention Foley catheter had to be placed overnight on 8/17.  Started on Flomax.  Foley catheter discontinued yesterday.  Has not voided yet.  We will make sure  he voids before he is discharged.  If he is not able to void then may have to reinsert Foley catheter and then he will need to be seen by urology in the outpatient setting.   Hypokalemia Repleted.  Magnesium was 1.9.  Mild normal anion gap metabolic acidosis Resolved.  Chronic alcohol abuse No evidence of withdrawal.  Continue multivitamin thiamine folate.  Seizure disorder Continue Keppra.  Depression Continue with home medications.  Chronic thrombocytopenia Stable.  Hyperlipidemia Continue statin.  Hypothyroidism TSH normal.  Continue levothyroxine.   DVT Prophylaxis: Lovenox Code Status: Full code Family Communication: Discussed with patient Disposition Plan: Patient from independent living facility.  Anticipate discharge later today if he is able to void.  If he is not able to void then Foley catheter will have to be reinserted.  Social work finding out if independent living facility will be able to take him back for the Foley.  Status is: Inpatient Remains inpatient appropriate because: COVID-19 infection, acute metabolic encephalopathy    Medications: Scheduled:  brimonidine  1 drop Both Eyes BID   enoxaparin (LOVENOX) injection  40 mg Subcutaneous QHS   folic acid  1 mg Oral Daily   levETIRAcetam  500 mg Oral BID   levothyroxine  37.5 mcg Oral Q0600   loratadine  10 mg Oral Daily   montelukast  10 mg Oral QHS   multivitamin with minerals  1 tablet Oral Daily   nirmatrelvir/ritonavir EUA  3 tablet Oral BID   tamsulosin  0.4 mg Oral Daily   thiamine  100 mg Oral Daily   Or   thiamine  100 mg Intravenous Daily  timolol  1 drop Both Eyes BID   traZODone  25 mg Oral QHS   valACYclovir  500 mg Oral q AM   Continuous: OIN:[id number], haloperidol lactate  Antibiotics: Anti-infectives (From admission, onward)    Start     Dose/Rate Route Frequency Ordered Stop   06/20/22 0700  valACYclovir (VALTREX) tablet 500 mg        500 mg Oral Every morning  06/19/22 1141     06/18/22 2230  nirmatrelvir/ritonavir EUA (PAXLOVID) 3 tablet        3 tablet Oral 2 times daily 06/18/22 2102 06/23/22 2159       Objective:  Vital Signs  Vitals:   06/20/22 0637 06/20/22 1300 06/20/22 2202 06/21/22 0655  BP: (!) 150/101 135/86 116/69 134/87  Pulse: 77 90 96 74  Resp: '19 16 20 18  '$ Temp: 98.5 F (36.9 C) 98 F (36.7 C) 98.3 F (36.8 C) 98 F (36.7 C)  TempSrc: Oral  Oral Oral  SpO2: 92% 96% 90% 93%  Weight:      Height:        Intake/Output Summary (Last 24 hours) at 06/21/2022 1048 Last data filed at 06/21/2022 0800 Gross per 24 hour  Intake --  Output 1350 ml  Net -1350 ml    Filed Weights   06/18/22 1532 06/18/22 2232  Weight: 88.5 kg 87.2 kg    General appearance: Awake alert.  In no distress Resp: Clear to auscultation bilaterally.  Normal effort Cardio: S1-S2 is normal regular.  No S3-S4.  No rubs murmurs or bruit GI: Abdomen is soft.  Nontender nondistended.  Bowel sounds are present normal.  No masses organomegaly   Lab Results:  Data Reviewed: I have personally reviewed following labs and reports of the imaging studies  CBC: Recent Labs  Lab 06/18/22 1604 06/19/22 0418  WBC 4.3 4.9  NEUTROABS 2.1 2.5  HGB 14.5 13.5  HCT 41.2 39.6  MCV 91.4 95.0  PLT 133* 123*     Basic Metabolic Panel: Recent Labs  Lab 06/18/22 1604 06/19/22 0409 06/19/22 0418 06/20/22 0431  NA 136  --  137 136  K 3.9  --  3.3* 3.9  CL 104  --  104 105  CO2 19*  --  25 24  GLUCOSE 97  --  98 97  BUN 11  --  13 10  CREATININE 0.96  --  0.91 0.91  CALCIUM 8.9  --  8.3* 8.3*  MG  --  1.9  --   --      GFR: Estimated Creatinine Clearance: 75.9 mL/min (by C-G formula based on SCr of 0.91 mg/dL).  Liver Function Tests: Recent Labs  Lab 06/18/22 1604 06/19/22 0418  AST 37 34  ALT 30 28  ALKPHOS 52 51  BILITOT 1.2 0.8  PROT 7.4 6.5  ALBUMIN 4.3 3.8     Recent Labs  Lab 06/18/22 1604  AMMONIA 21        CBG: Recent Labs  Lab 06/18/22 1517  GLUCAP 101*     Thyroid Function Tests: Recent Labs    06/18/22 2141  TSH 3.654     Anemia Panel: Recent Labs    06/18/22 2141  VITAMINB12 462  FERRITIN 150     Recent Results (from the past 240 hour(s))  Resp Panel by RT-PCR (Flu A&B, Covid) Anterior Nasal Swab     Status: Abnormal   Collection Time: 06/18/22  4:04 PM   Specimen: Anterior Nasal Swab  Result Value Ref  Range Status   SARS Coronavirus 2 by RT PCR POSITIVE (A) NEGATIVE Final    Comment: (NOTE) SARS-CoV-2 target nucleic acids are DETECTED.  The SARS-CoV-2 RNA is generally detectable in upper respiratory specimens during the acute phase of infection. Positive results are indicative of the presence of the identified virus, but do not rule out bacterial infection or co-infection with other pathogens not detected by the test. Clinical correlation with patient history and other diagnostic information is necessary to determine patient infection status. The expected result is Negative.  Fact Sheet for Patients: EntrepreneurPulse.com.au  Fact Sheet for Healthcare Providers: IncredibleEmployment.be  This test is not yet approved or cleared by the [place] FDA and  has been authorized for detection and/or diagnosis of SARS-CoV-2 by FDA under an Emergency Use Authorization (EUA).  This EUA will remain in effect (meaning this test can be used) for the duration of  the COVID-19 declaration under Section 564(b)(1) of the A ct, 21 U.S.C. section 360bbb-3(b)(1), unless the authorization is terminated or revoked sooner.     Influenza A by PCR NEGATIVE NEGATIVE Final   Influenza B by PCR NEGATIVE NEGATIVE Final    Comment: (NOTE) The Xpert Xpress SARS-CoV-2/FLU/RSV plus assay is intended as an aid in the diagnosis of influenza from Nasopharyngeal swab specimens and should not be used as a sole basis for treatment. Nasal  washings and aspirates are unacceptable for Xpert Xpress SARS-CoV-2/FLU/RSV testing.  Fact Sheet for Patients: EntrepreneurPulse.com.au  Fact Sheet for Healthcare Providers: IncredibleEmployment.be  This test is not yet approved or cleared by the [place] FDA and has been authorized for detection and/or diagnosis of SARS-CoV-2 by FDA under an Emergency Use Authorization (EUA). This EUA will remain in effect (meaning this test can be used) for the duration of the COVID-19 declaration under Section 564(b)(1) of the Act, 21 U.S.C. section 360bbb-3(b)(1), unless the authorization is terminated or revoked.  Performed at [facility], [place] [address], [place], [place] [address]       Radiology Studies: No results found.     LOS: 1 day   Nekayla Heider [employer] on [email or web address]  06/21/2022, 10:48 AM

## 2022-06-21 NOTE — TOC Progression Note (Signed)
Transition of Care (TOC) - Progression Note    Patient Details  Name: Robert Cummings. MRN: [id number] Date of Birth: October 09, 1946  Transition of Care [facility]) CM/SW Contact  Ross Ludwig, [place] Phone Number: 06/21/2022, 3:40 PM  Clinical Narrative:     CSW contacted [facility] to see if patient can return today because he is Covid+.  CSW spoke to Andi Hence, [phone] who spoke to oncall supervisor, and she said patient can not return until the 24th of August because he needs to finish his quarantine period, and they have to do some special deep cleaning in his apartment before he can return.  CSW updated attending physician, bedside nurse, and patient's brother.  CSW to continue to follow patient's progress throughout discharge planning.   Expected Discharge Plan: Assisted Living Barriers to Discharge: Continued Medical Work up  Expected Discharge Plan and Services Expected Discharge Plan: Assisted Living         Expected Discharge Date: 06/21/22                                     Social Determinants of Health (SDOH) Interventions    Readmission Risk Interventions     No data to display

## 2022-06-21 NOTE — Progress Notes (Signed)
Patient voided in toilet, unable to measure how much.  Post-void residual measured by bladder scanner: 0 mL.  MD notified.  Angie Fava, RN

## 2022-06-22 DIAGNOSIS — G40909 Epilepsy, unspecified, not intractable, without status epilepticus: Secondary | ICD-10-CM | POA: Diagnosis not present

## 2022-06-22 DIAGNOSIS — G9349 Other encephalopathy: Secondary | ICD-10-CM | POA: Diagnosis not present

## 2022-06-22 DIAGNOSIS — U071 COVID-19: Secondary | ICD-10-CM | POA: Diagnosis not present

## 2022-06-22 MED ORDER — QUETIAPINE FUMARATE 25 MG PO TABS: 25.0000 mg | ORAL_TABLET | Freq: Two times a day (BID) | ORAL | Status: DC

## 2022-06-22 MED ORDER — QUETIAPINE FUMARATE 25 MG PO TABS
25.0000 mg | ORAL_TABLET | Freq: Every day | ORAL | Status: DC
  Administered 2022-06-22 – 2022-06-25 (×4): 25 mg via ORAL
  Filled 2022-06-22 (×4): qty 1

## 2022-06-22 NOTE — Progress Notes (Signed)
[facility] PROGRESS NOTE   Robert D Freas Jr. MGQ:[id number] DOB: [birth year] DOA: 06/18/2022  PCP: Shelly Coss, MD  Brief History/Interval Summary: 76 y.o. male with medical history significant of chronic alcohol abuse, mild to moderate dementia, history of concussion/TBI in 2000, seizure disorder, depression, chronic thrombocytopenia, hyperlipidemia, hypothyroidism presented to the ED from his assisted living facility for evaluation of altered mental status/confusion.  Patient tested positive for COVID 2 days prior to admission at his facility.  Staff reported that patient is a daily alcohol user but has not had alcohol in a few days.    Consultants: None  Procedures: None    Subjective/Interval History: Informed by nursing staff that the patient was agitated overnight.  Seems to be appropriate this morning.  Denies any complaints.    Assessment/Plan:  Acute metabolic encephalopathy Thought to be triggered by COVID-19 infection.  CT head was negative for acute findings.  No focal neurological deficits on examination. TSH noted to be normal.  B12 level 462.  UA was unremarkable.  Urine drug screen unremarkable.  Alcohol level less than 10. Mentation is stable.  Patient is followed by neurology for her seizure disorder.  Neurology notes reviewed.  Patient also apparently has a history of dementia.  Memantine was being considered but not initiated yet. Mental status is stable.  Experiencing some sundowning.  Can try Seroquel.  COVID-19 infection Chest x-ray did not show any acute findings.  He is not hypoxic.  Does not have any respiratory symptoms.  CRP is only 2.9.  Started on Paxlovid.  There was no indication to initiate steroids.  Urinary retention Foley catheter had to be placed overnight on 8/17.  Started on Flomax.   Foley catheter was subsequently discontinued.  He has been able to void on his own.  Continue to monitor.    Hypokalemia Repleted.  Magnesium was  1.9.  Recheck labs tomorrow.  Mild normal anion gap metabolic acidosis Resolved.  Chronic alcohol abuse No evidence of withdrawal.  Continue multivitamin thiamine folate.  Seizure disorder Continue Keppra.  Depression Continue with home medications.  Chronic thrombocytopenia Stable.  Hyperlipidemia Continue statin.  Hypothyroidism TSH normal.  Continue levothyroxine.   DVT Prophylaxis: Lovenox Code Status: Full code Family Communication: Discussed with patient Disposition Plan: Patient from independent living facility.  Patient has been stable for discharge.  However the facility cannot take him back due to his COVID-19 status.  The earliest they can take him is August 24.    Status is: Inpatient Remains inpatient appropriate because: COVID-19 infection, acute metabolic encephalopathy    Medications: Scheduled:  brimonidine  1 drop Both Eyes BID   enoxaparin (LOVENOX) injection  40 mg Subcutaneous QHS   folic acid  1 mg Oral Daily   levETIRAcetam  500 mg Oral BID   levothyroxine  37.5 mcg Oral Q0600   loratadine  10 mg Oral Daily   montelukast  10 mg Oral QHS   multivitamin with minerals  1 tablet Oral Daily   nirmatrelvir/ritonavir EUA  3 tablet Oral BID   QUEtiapine  25 mg Oral BID   tamsulosin  0.4 mg Oral Daily   thiamine  100 mg Oral Daily   Or   thiamine  100 mg Intravenous Daily   timolol  1 drop Both Eyes BID   traZODone  25 mg Oral QHS   valACYclovir  500 mg Oral q AM   Continuous: OIZ:[id number], haloperidol lactate  Antibiotics: Anti-infectives (From admission, onward)    Start  Dose/Rate Route Frequency Ordered Stop   06/21/22 0000  nirmatrelvir/ritonavir EUA (PAXLOVID) 20 x 150 MG & 10 x '100MG'$  TABS        3 tablet Oral 2 times daily 06/21/22 1101 06/24/22 2359   06/20/22 0700  valACYclovir (VALTREX) tablet 500 mg        500 mg Oral Every morning 06/19/22 1141     06/18/22 2230  nirmatrelvir/ritonavir EUA (PAXLOVID) 3 tablet        3  tablet Oral 2 times daily 06/18/22 2102 06/23/22 2159       Objective:  Vital Signs  Vitals:   06/21/22 1310 06/22/22 0532 06/22/22 0626 06/22/22 0635  BP: (!) 143/78   101/65  Pulse: 72   73  Resp: 18   18  Temp: 97.8 F (36.6 C)  98.3 F (36.8 C) 98.2 F (36.8 C)  TempSrc: Oral  Oral Oral  SpO2: 92%     Weight:  85.7 kg    Height:        Intake/Output Summary (Last 24 hours) at 06/22/2022 1057 Last data filed at 06/22/2022 1053 Gross per 24 hour  Intake 1220 ml  Output --  Net 1220 ml    Filed Weights   06/18/22 1532 06/18/22 2232 06/22/22 0532  Weight: 88.5 kg 87.2 kg 85.7 kg    General appearance: Awake alert.  In no distress.  Distracted Resp: Clear to auscultation bilaterally.  Normal effort Cardio: S1-S2 is normal regular.  No S3-S4.  No rubs murmurs or bruit GI: Abdomen is soft.  Nontender nondistended.  Bowel sounds are present normal.  No masses organomegaly    Lab Results:  Data Reviewed: I have personally reviewed following labs and reports of the imaging studies  CBC: Recent Labs  Lab 06/18/22 1604 06/19/22 0418  WBC 4.3 4.9  NEUTROABS 2.1 2.5  HGB 14.5 13.5  HCT 41.2 39.6  MCV 91.4 95.0  PLT 133* 123*     Basic Metabolic Panel: Recent Labs  Lab 06/18/22 1604 06/19/22 0409 06/19/22 0418 06/20/22 0431  NA 136  --  137 136  K 3.9  --  3.3* 3.9  CL 104  --  104 105  CO2 19*  --  25 24  GLUCOSE 97  --  98 97  BUN 11  --  13 10  CREATININE 0.96  --  0.91 0.91  CALCIUM 8.9  --  8.3* 8.3*  MG  --  1.9  --   --      GFR: Estimated Creatinine Clearance: 75.9 mL/min (by C-G formula based on SCr of 0.91 mg/dL).  Liver Function Tests: Recent Labs  Lab 06/18/22 1604 06/19/22 0418  AST 37 34  ALT 30 28  ALKPHOS 52 51  BILITOT 1.2 0.8  PROT 7.4 6.5  ALBUMIN 4.3 3.8     Recent Labs  Lab 06/18/22 1604  AMMONIA 21       CBG: Recent Labs  Lab 06/18/22 1517  GLUCAP 101*     Recent Results (from the past 240  hour(s))  Resp Panel by RT-PCR (Flu A&B, Covid) Anterior Nasal Swab     Status: Abnormal   Collection Time: 06/18/22  4:04 PM   Specimen: Anterior Nasal Swab  Result Value Ref Range Status   SARS Coronavirus 2 by RT PCR POSITIVE (A) NEGATIVE Final    Comment: (NOTE) SARS-CoV-2 target nucleic acids are DETECTED.  The SARS-CoV-2 RNA is generally detectable in upper respiratory specimens during the acute phase of  infection. Positive results are indicative of the presence of the identified virus, but do not rule out bacterial infection or co-infection with other pathogens not detected by the test. Clinical correlation with patient history and other diagnostic information is necessary to determine patient infection status. The expected result is Negative.  Fact Sheet for Patients: EntrepreneurPulse.com.au  Fact Sheet for Healthcare Providers: IncredibleEmployment.be  This test is not yet approved or cleared by the [place] FDA and  has been authorized for detection and/or diagnosis of SARS-CoV-2 by FDA under an Emergency Use Authorization (EUA).  This EUA will remain in effect (meaning this test can be used) for the duration of  the COVID-19 declaration under Section 564(b)(1) of the A ct, 21 U.S.C. section 360bbb-3(b)(1), unless the authorization is terminated or revoked sooner.     Influenza A by PCR NEGATIVE NEGATIVE Final   Influenza B by PCR NEGATIVE NEGATIVE Final    Comment: (NOTE) The Xpert Xpress SARS-CoV-2/FLU/RSV plus assay is intended as an aid in the diagnosis of influenza from Nasopharyngeal swab specimens and should not be used as a sole basis for treatment. Nasal washings and aspirates are unacceptable for Xpert Xpress SARS-CoV-2/FLU/RSV testing.  Fact Sheet for Patients: EntrepreneurPulse.com.au  Fact Sheet for Healthcare Providers: IncredibleEmployment.be  This test is not yet  approved or cleared by the [place] FDA and has been authorized for detection and/or diagnosis of SARS-CoV-2 by FDA under an Emergency Use Authorization (EUA). This EUA will remain in effect (meaning this test can be used) for the duration of the COVID-19 declaration under Section 564(b)(1) of the Act, 21 U.S.C. section 360bbb-3(b)(1), unless the authorization is terminated or revoked.  Performed at [facility], [place] [address], [place], [place] [address]       Radiology Studies: No results found.     LOS: 2 days   [facility] Pager on [email or web address]  06/22/2022, 10:57 AM

## 2022-06-23 DIAGNOSIS — U071 COVID-19: Secondary | ICD-10-CM | POA: Diagnosis not present

## 2022-06-23 DIAGNOSIS — G9341 Metabolic encephalopathy: Secondary | ICD-10-CM | POA: Diagnosis not present

## 2022-06-23 LAB — BASIC METABOLIC PANEL
Anion gap: 10 (ref 5–15)
BUN: 14 mg/dL (ref 8–23)
CO2: 22 mmol/L (ref 22–32)
Calcium: 8.7 mg/dL — ABNORMAL LOW (ref 8.9–10.3)
Chloride: 109 mmol/L (ref 98–111)
Creatinine, Ser: 0.75 mg/dL (ref 0.61–1.24)
GFR, Estimated: >60 mL/min (ref >60)
Glucose, Bld: 98 mg/dL (ref 70–99)
Potassium: 3.7 mmol/L (ref 3.5–5.1)
Sodium: 141 mmol/L (ref 135–145)

## 2022-06-23 LAB — MAGNESIUM: Magnesium: 2.1 mg/dL (ref 1.7–2.4)

## 2022-06-23 NOTE — Progress Notes (Signed)
[facility] PROGRESS NOTE   Robert D Molinari Jr. NWG:[id number] DOB: 1946/08/01 DOA: 06/18/2022  PCP: Shelly Coss, MD  Brief History/Interval Summary: 76 y.o. male with medical history significant of chronic alcohol abuse, mild to moderate dementia, history of concussion/TBI in 2000, seizure disorder, depression, chronic thrombocytopenia, hyperlipidemia, hypothyroidism presented to the ED from his assisted living facility for evaluation of altered mental status/confusion.  Patient tested positive for COVID 2 days prior to admission at his facility.  Staff reported that patient is a daily alcohol user but has not had alcohol in a few days.    Consultants: None  Procedures: None    Subjective/Interval History: No overnight events per nursing staff.  Slept reasonably well after midnight.  Did require a dose of Haldol.  Seems to be cooperative this morning.  Denies any complaints.  Specifically no shortness of breath abdominal pain nausea or vomiting.    Assessment/Plan:  Acute metabolic encephalopathy Thought to be triggered by COVID-19 infection.  CT head was negative for acute findings.  No focal neurological deficits on examination. TSH noted to be normal.  B12 level 462.  UA was unremarkable.  Urine drug screen unremarkable.  Alcohol level less than 10. Mentation is stable.  Patient is followed by neurology for her seizure disorder.  Neurology notes reviewed.  Patient also apparently has a history of dementia.  Memantine was being considered but not initiated yet. Experiences sundowning.  Started on Seroquel.  Hopefully will need only in the hospital.  May not need to continue when he is discharged.  COVID-19 infection Chest x-ray did not show any acute findings.  He was not hypoxic.  Does not have any respiratory symptoms.  CRP was only 2.9.  Started on 5-day course of Paxlovid.  There was no indication to initiate steroids.  Urinary retention Foley catheter had to be  placed overnight on 8/17.  Started on Flomax.   Foley catheter was subsequently discontinued.  He has been able to void on his own.  Continue to monitor.    Hypokalemia Repleted.    Mild normal anion gap metabolic acidosis Resolved.  Chronic alcohol abuse No evidence of withdrawal.  Continue multivitamin thiamine folate.  Seizure disorder Continue Keppra.  Depression Continue with home medications.  Chronic thrombocytopenia Stable.  Hyperlipidemia Continue statin.  Hypothyroidism TSH normal.  Continue levothyroxine.   DVT Prophylaxis: Lovenox Code Status: Full code Family Communication: Discussed with patient Disposition Plan: Patient from independent living facility.  Patient has been stable for discharge.  However the facility cannot take him back due to his COVID-19 status.  The earliest they can take him is August 24.    Status is: Inpatient Remains inpatient appropriate because: COVID-19 infection, acute metabolic encephalopathy    Medications: Scheduled:  brimonidine  1 drop Both Eyes BID   enoxaparin (LOVENOX) injection  40 mg Subcutaneous QHS   folic acid  1 mg Oral Daily   levETIRAcetam  500 mg Oral BID   levothyroxine  37.5 mcg Oral Q0600   loratadine  10 mg Oral Daily   montelukast  10 mg Oral QHS   multivitamin with minerals  1 tablet Oral Daily   nirmatrelvir/ritonavir EUA  3 tablet Oral BID   QUEtiapine  25 mg Oral QHS   tamsulosin  0.4 mg Oral Daily   thiamine  100 mg Oral Daily   Or   thiamine  100 mg Intravenous Daily   timolol  1 drop Both Eyes BID   traZODone  25  mg Oral QHS   valACYclovir  500 mg Oral q AM   Continuous: [id number], haloperidol lactate  Antibiotics: Anti-infectives (From admission, onward)    Start     Dose/Rate Route Frequency Ordered Stop   06/21/22 0000  nirmatrelvir/ritonavir EUA (PAXLOVID) 20 x 150 MG & 10 x '100MG'$  TABS        3 tablet Oral 2 times daily 06/21/22 1101 06/24/22 2359   06/20/22 0700   valACYclovir (VALTREX) tablet 500 mg        500 mg Oral Every morning 06/19/22 1141     06/18/22 2230  nirmatrelvir/ritonavir EUA (PAXLOVID) 3 tablet        3 tablet Oral 2 times daily 06/18/22 2102 06/23/22 2159       Objective:  Vital Signs  Vitals:   06/22/22 0635 06/22/22 1230 06/22/22 2027 06/22/22 2310  BP: 101/65 137/89 (!) 149/93 (!) 143/79  Pulse: 73 74 (!) 57 73  Resp: '18 16  18  '$ Temp: 98.2 F (36.8 C) 97.9 F (36.6 C) 98.4 F (36.9 C) 97.6 F (36.4 C)  TempSrc: Oral Oral Oral Oral  SpO2:  97% 96% 92%  Weight:      Height:        Intake/Output Summary (Last 24 hours) at 06/23/2022 1025 Last data filed at 06/23/2022 1013 Gross per 24 hour  Intake 2040 ml  Output 350 ml  Net 1690 ml    Filed Weights   06/18/22 1532 06/18/22 2232 06/22/22 0532  Weight: 88.5 kg 87.2 kg 85.7 kg    General appearance: Awake alert.  In no distress Resp: Clear to auscultation bilaterally.  Normal effort Cardio: S1-S2 is normal regular.  No S3-S4.  No rubs murmurs or bruit GI: Abdomen is soft.  Nontender nondistended.  Bowel sounds are present normal.  No masses organomegaly    Lab Results:  Data Reviewed: I have personally reviewed following labs and reports of the imaging studies  CBC: Recent Labs  Lab 06/18/22 1604 06/19/22 0418  WBC 4.3 4.9  NEUTROABS 2.1 2.5  HGB 14.5 13.5  HCT 41.2 39.6  MCV 91.4 95.0  PLT 133* 123*     Basic Metabolic Panel: Recent Labs  Lab 06/18/22 1604 06/19/22 0409 06/19/22 0418 06/20/22 0431 06/23/22 0403  NA 136  --  137 136 141  K 3.9  --  3.3* 3.9 3.7  CL 104  --  104 105 109  CO2 19*  --  '25 24 22  '$ GLUCOSE 97  --  98 97 98  BUN 11  --  '13 10 14  '$ CREATININE 0.96  --  0.91 0.91 0.75  CALCIUM 8.9  --  8.3* 8.3* 8.7*  MG  --  1.9  --   --  2.1     GFR: Estimated Creatinine Clearance: 86.3 mL/min (by C-G formula based on SCr of 0.75 mg/dL).  Liver Function Tests: Recent Labs  Lab 06/18/22 1604 06/19/22 0418  AST  37 34  ALT 30 28  ALKPHOS 52 51  BILITOT 1.2 0.8  PROT 7.4 6.5  ALBUMIN 4.3 3.8     Recent Labs  Lab 06/18/22 1604  AMMONIA 21       CBG: Recent Labs  Lab 06/18/22 1517  GLUCAP 101*     Recent Results (from the past 240 hour(s))  Resp Panel by RT-PCR (Flu A&B, Covid) Anterior Nasal Swab     Status: Abnormal   Collection Time: 06/18/22  4:04 PM   Specimen: Anterior Nasal  Swab  Result Value Ref Range Status   SARS Coronavirus 2 by RT PCR POSITIVE (A) NEGATIVE Final    Comment: (NOTE) SARS-CoV-2 target nucleic acids are DETECTED.  The SARS-CoV-2 RNA is generally detectable in upper respiratory specimens during the acute phase of infection. Positive results are indicative of the presence of the identified virus, but do not rule out bacterial infection or co-infection with other pathogens not detected by the test. Clinical correlation with patient history and other diagnostic information is necessary to determine patient infection status. The expected result is Negative.  Fact Sheet for Patients: EntrepreneurPulse.com.au  Fact Sheet for Healthcare Providers: IncredibleEmployment.be  This test is not yet approved or cleared by the [place] FDA and  has been authorized for detection and/or diagnosis of SARS-CoV-2 by FDA under an Emergency Use Authorization (EUA).  This EUA will remain in effect (meaning this test can be used) for the duration of  the COVID-19 declaration under Section 564(b)(1) of the A ct, 21 U.S.C. section 360bbb-3(b)(1), unless the authorization is terminated or revoked sooner.     Influenza A by PCR NEGATIVE NEGATIVE Final   Influenza B by PCR NEGATIVE NEGATIVE Final    Comment: (NOTE) The Xpert Xpress SARS-CoV-2/FLU/RSV plus assay is intended as an aid in the diagnosis of influenza from Nasopharyngeal swab specimens and should not be used as a sole basis for treatment. Nasal washings and aspirates  are unacceptable for Xpert Xpress SARS-CoV-2/FLU/RSV testing.  Fact Sheet for Patients: EntrepreneurPulse.com.au  Fact Sheet for Healthcare Providers: IncredibleEmployment.be  This test is not yet approved or cleared by the [place] FDA and has been authorized for detection and/or diagnosis of SARS-CoV-2 by FDA under an Emergency Use Authorization (EUA). This EUA will remain in effect (meaning this test can be used) for the duration of the COVID-19 declaration under Section 564(b)(1) of the Act, 21 U.S.C. section 360bbb-3(b)(1), unless the authorization is terminated or revoked.  Performed at [facility], [place] [address], [place], [place] [address]       Radiology Studies: No results found.     LOS: 3 days   Maimuna Leaman [employer] on [email or web address]  06/23/2022, 10:25 AM

## 2022-06-24 DIAGNOSIS — U071 COVID-19: Secondary | ICD-10-CM | POA: Diagnosis not present

## 2022-06-24 DIAGNOSIS — G9341 Metabolic encephalopathy: Secondary | ICD-10-CM | POA: Diagnosis not present

## 2022-06-24 NOTE — Progress Notes (Signed)
[facility] PROGRESS NOTE   Robert D Drumwright Jr. KPT:[id number] DOB: 1946-08-17 DOA: 06/18/2022  PCP: Shelly Coss, MD  Brief History/Interval Summary: 76 y.o. male with medical history significant of chronic alcohol abuse, mild to moderate dementia, history of concussion/TBI in 2000, seizure disorder, depression, chronic thrombocytopenia, hyperlipidemia, hypothyroidism presented to the ED from his assisted living facility for evaluation of altered mental status/confusion.  Patient tested positive for COVID 2 days prior to admission at his facility.  Staff reported that patient is a daily alcohol user but has not had alcohol in a few days prior to this admission.    Consultants: None  Procedures: None    Subjective/Interval History: No overnight events reported by nursing staff.  Patient denies any complaints this morning.  Denies specifically any shortness of breath or chest pain.  No nausea or vomiting.    Assessment/Plan:  Acute metabolic encephalopathy Thought to be triggered by COVID-19 infection.  CT head was negative for acute findings.  No focal neurological deficits on examination. TSH noted to be normal.  B12 level 462.  UA was unremarkable.  Urine drug screen unremarkable.  Alcohol level less than 10. Mentation is stable.  Patient is followed by neurology for her seizure disorder.  Neurology notes reviewed.  Patient also apparently has a history of dementia.  Memantine was being considered but not initiated yet. Experiences sundowning.  Started on Seroquel.  Hopefully will need only in the hospital.  May not need to continue when he is discharged.  Has not required Haldol in 2 days.  COVID-19 infection Chest x-ray did not show any acute findings.  He was not hypoxic.  Does not have any respiratory symptoms.  CRP was only 2.9.  Given a 5-day course of Paxlovid.  There was no indication to initiate steroids.  Urinary retention Foley catheter had to be placed  overnight on 8/17.  Started on Flomax.   Foley catheter was subsequently discontinued.  He has been able to void on his own.  Continue to monitor.    Hypokalemia Repleted.    Mild normal anion gap metabolic acidosis Resolved.  Chronic alcohol abuse No evidence of withdrawal.  Continue multivitamin thiamine folate.  Seizure disorder Continue Keppra.  Depression Continue with home medications.  Chronic thrombocytopenia Stable.  Hyperlipidemia Continue statin.  Hypothyroidism TSH normal.  Continue levothyroxine.   DVT Prophylaxis: Lovenox Code Status: Full code Family Communication: Discussed with patient.  We will update his brother later today. Disposition Plan: Patient from independent living facility.  Patient has been stable for discharge.  However the facility cannot take him back due to his COVID-19 status.  The earliest they can take him is August 24.    Status is: Inpatient Remains inpatient appropriate because: COVID-19 infection, acute metabolic encephalopathy    Medications: Scheduled:  brimonidine  1 drop Both Eyes BID   enoxaparin (LOVENOX) injection  40 mg Subcutaneous QHS   folic acid  1 mg Oral Daily   levETIRAcetam  500 mg Oral BID   levothyroxine  37.5 mcg Oral Q0600   loratadine  10 mg Oral Daily   montelukast  10 mg Oral QHS   multivitamin with minerals  1 tablet Oral Daily   QUEtiapine  25 mg Oral QHS   tamsulosin  0.4 mg Oral Daily   thiamine  100 mg Oral Daily   Or   thiamine  100 mg Intravenous Daily   timolol  1 drop Both Eyes BID   traZODone  25 mg  Oral QHS   valACYclovir  500 mg Oral q AM   Continuous: [id number]:[id number], haloperidol lactate  Antibiotics: Anti-infectives (From admission, onward)    Start     Dose/Rate Route Frequency Ordered Stop   06/21/22 0000  nirmatrelvir/ritonavir EUA (PAXLOVID) 20 x 150 MG & 10 x '100MG'$  TABS        3 tablet Oral 2 times daily 06/21/22 1101 06/24/22 2359   06/20/22 0700  valACYclovir  (VALTREX) tablet 500 mg        500 mg Oral Every morning 06/19/22 1141     06/18/22 2230  nirmatrelvir/ritonavir EUA (PAXLOVID) 3 tablet        3 tablet Oral 2 times daily 06/18/22 2102 06/23/22 1036       Objective:  Vital Signs  Vitals:   06/22/22 2310 06/23/22 1447 06/23/22 2014 06/23/22 2016  BP: (!) 143/79 139/85 (!) 145/99 (!) 124/93  Pulse: 73 72 90 86  Resp: '18 18 19 16  '$ Temp: 97.6 F (36.4 C) 98.2 F (36.8 C) (!) 97.5 F (36.4 C)   TempSrc: Oral Oral Oral   SpO2: 92% 95% 94% 95%  Weight:      Height:        Intake/Output Summary (Last 24 hours) at 06/24/2022 0954 Last data filed at 06/24/2022 0600 Gross per 24 hour  Intake 850 ml  Output --  Net 850 ml    Filed Weights   06/18/22 1532 06/18/22 2232 06/22/22 0532  Weight: 88.5 kg 87.2 kg 85.7 kg    General appearance: Awake alert.  In no distress. pleasantly confused Resp: Clear to auscultation bilaterally.  Normal effort Cardio: S1-S2 is normal regular.  No S3-S4.  No rubs murmurs or bruit GI: Abdomen is soft.  Nontender nondistended.  Bowel sounds are present normal.  No masses organomegaly    Lab Results:  Data Reviewed: I have personally reviewed following labs and reports of the imaging studies  CBC: Recent Labs  Lab 06/18/22 1604 06/19/22 0418  WBC 4.3 4.9  NEUTROABS 2.1 2.5  HGB 14.5 13.5  HCT 41.2 39.6  MCV 91.4 95.0  PLT 133* 123*     Basic Metabolic Panel: Recent Labs  Lab 06/18/22 1604 06/19/22 0409 06/19/22 0418 06/20/22 0431 06/23/22 0403  NA 136  --  137 136 141  K 3.9  --  3.3* 3.9 3.7  CL 104  --  104 105 109  CO2 19*  --  '25 24 22  '$ GLUCOSE 97  --  98 97 98  BUN 11  --  '13 10 14  '$ CREATININE 0.96  --  0.91 0.91 0.75  CALCIUM 8.9  --  8.3* 8.3* 8.7*  MG  --  1.9  --   --  2.1     GFR: Estimated Creatinine Clearance: 86.3 mL/min (by C-G formula based on SCr of 0.75 mg/dL).  Liver Function Tests: Recent Labs  Lab 06/18/22 1604 06/19/22 0418  AST 37 34  ALT  30 28  ALKPHOS 52 51  BILITOT 1.2 0.8  PROT 7.4 6.5  ALBUMIN 4.3 3.8     Recent Labs  Lab 06/18/22 1604  AMMONIA 21       CBG: Recent Labs  Lab 06/18/22 1517  GLUCAP 101*     Recent Results (from the past 240 hour(s))  Resp Panel by RT-PCR (Flu A&B, Covid) Anterior Nasal Swab     Status: Abnormal   Collection Time: 06/18/22  4:04 PM   Specimen: Anterior Nasal Swab  Result Value Ref Range Status   SARS Coronavirus 2 by RT PCR POSITIVE (A) NEGATIVE Final    Comment: (NOTE) SARS-CoV-2 target nucleic acids are DETECTED.  The SARS-CoV-2 RNA is generally detectable in upper respiratory specimens during the acute phase of infection. Positive results are indicative of the presence of the identified virus, but do not rule out bacterial infection or co-infection with other pathogens not detected by the test. Clinical correlation with patient history and other diagnostic information is necessary to determine patient infection status. The expected result is Negative.  Fact Sheet for Patients: EntrepreneurPulse.com.au  Fact Sheet for Healthcare Providers: IncredibleEmployment.be  This test is not yet approved or cleared by the [place] FDA and  has been authorized for detection and/or diagnosis of SARS-CoV-2 by FDA under an Emergency Use Authorization (EUA).  This EUA will remain in effect (meaning this test can be used) for the duration of  the COVID-19 declaration under Section 564(b)(1) of the A ct, 21 U.S.C. section 360bbb-3(b)(1), unless the authorization is terminated or revoked sooner.     Influenza A by PCR NEGATIVE NEGATIVE Final   Influenza B by PCR NEGATIVE NEGATIVE Final    Comment: (NOTE) The Xpert Xpress SARS-CoV-2/FLU/RSV plus assay is intended as an aid in the diagnosis of influenza from Nasopharyngeal swab specimens and should not be used as a sole basis for treatment. Nasal washings and aspirates are  unacceptable for Xpert Xpress SARS-CoV-2/FLU/RSV testing.  Fact Sheet for Patients: EntrepreneurPulse.com.au  Fact Sheet for Healthcare Providers: IncredibleEmployment.be  This test is not yet approved or cleared by the [place] FDA and has been authorized for detection and/or diagnosis of SARS-CoV-2 by FDA under an Emergency Use Authorization (EUA). This EUA will remain in effect (meaning this test can be used) for the duration of the COVID-19 declaration under Section 564(b)(1) of the Act, 21 U.S.C. section 360bbb-3(b)(1), unless the authorization is terminated or revoked.  Performed at [facility], [place] [address], [place], [place] [address]       Radiology Studies: No results found.     LOS: 4 days   Kaisei Gilbo [employer] on [email or web address]  06/24/2022, 9:54 AM

## 2022-06-24 NOTE — TOC Progression Note (Signed)
Transition of Care (TOC) - Progression Note    Patient Details  Name: Robert Cummings. MRN: [id number] Date of Birth: May 09, 1946  Transition of Care [facility]) CM/SW Contact  Roseanne Kaufman, RN Phone Number: 06/24/2022, 3:56 PM  Clinical Narrative:   Patient to return to ALF ([facility]) on 06/26/22. TOC will continue to follow.    Expected Discharge Plan:  ([facility]) Barriers to Discharge: Continued Medical Work up  Expected Discharge Plan and Services Expected Discharge Plan:  ([facility])   Discharge Planning Services: CM Consult   Living arrangements for the past 2 months: [place] Expected Discharge Date: 06/21/22                                     Social Determinants of Health (SDOH) Interventions    Readmission Risk Interventions     No data to display

## 2022-06-25 DIAGNOSIS — E039 Hypothyroidism, unspecified: Secondary | ICD-10-CM | POA: Diagnosis not present

## 2022-06-25 DIAGNOSIS — G9341 Metabolic encephalopathy: Secondary | ICD-10-CM | POA: Diagnosis not present

## 2022-06-25 DIAGNOSIS — F1010 Alcohol abuse, uncomplicated: Secondary | ICD-10-CM | POA: Diagnosis not present

## 2022-06-25 DIAGNOSIS — E872 Acidosis, unspecified: Secondary | ICD-10-CM

## 2022-06-25 DIAGNOSIS — U071 COVID-19: Secondary | ICD-10-CM | POA: Diagnosis not present

## 2022-06-25 DIAGNOSIS — D696 Thrombocytopenia, unspecified: Secondary | ICD-10-CM

## 2022-06-25 NOTE — Progress Notes (Signed)
PROGRESS NOTE    Robert D Stipp Jr.  YQM:[id number] DOB: 07-28-1946 DOA: 06/18/2022 PCP: Shelly Coss, MD   Brief Narrative:  76 y.o. male with medical history significant of chronic alcohol abuse, mild to moderate dementia, history of concussion/TBI in 2000, seizure disorder, depression, chronic thrombocytopenia, hyperlipidemia, hypothyroidism presented to the ED from his assisted living facility for evaluation of altered mental status/confusion.  Patient tested positive for COVID 2 days prior to admission at his facility.  Staff reported that patient is a daily alcohol user but has not had alcohol in a few days prior to this admission.  He was admitted for acute metabolic encephalopathy possibly from COVID-19 infection.  He was treated with Paxlovid.  Chest x-ray did not show any acute findings.  Assessment & Plan:   Acute metabolic encephalopathy -Thought to be triggered by COVID-19 infection.  CT head was negative for acute findings.  No focal neurological deficits on examination. -TSH noted to be normal.  B12 level 462.  UA was unremarkable.  -Urine drug screen unremarkable.  Alcohol level less than 10. -Mentation is stable.  Patient is followed by neurology for his seizure disorder.  Patient also apparently has a history of dementia.  Memantine was being considered by neurology as an outpatient but not initiated yet. -Seroquel started during this hospitalization because of sundowning.  Hopefully will need only in the hospital.  May not need to continue when he is discharged.  Has not required Haldol in a few days.   COVID-19 infection -Chest x-ray did not show any acute findings.  He was not hypoxic.  Does not have any respiratory symptoms.  CRP was only 2.9.  Given a 5-day course of Paxlovid.  There was no indication to initiate steroids.   Urinary retention -Foley catheter had to be placed overnight on 8/17.  Started on Flomax.   -Foley catheter was subsequently discontinued.  He  has been able to void on his own.  Continue to monitor.     Hypokalemia -Resolved   Mild normal anion gap metabolic acidosis -Resolved.   Chronic alcohol abuse -No evidence of withdrawal.  Continue multivitamin, thiamine, folate.   Seizure disorder -Continue Keppra.   Depression -Continue with home medications.   Chronic thrombocytopenia -Stable.   Hyperlipidemia -Continue statin.   Hypothyroidism -TSH normal.  Continue levothyroxine.   DVT prophylaxis: Lovenox Code Status: Full Family Communication: None at bedside Disposition Plan: Status is: Inpatient Remains inpatient appropriate because: Of severity of illness. Patient from independent living facility.  Patient has been stable for discharge.  However the facility cannot take him back due to his COVID-19 status.  The earliest they can take him is August 24.    Consultants: None  Procedures: None  Antimicrobials:  Anti-infectives (From admission, onward)    Start     Dose/Rate Route Frequency Ordered Stop   06/21/22 0000  nirmatrelvir/ritonavir EUA (PAXLOVID) 20 x 150 MG & 10 x '100MG'$  TABS        3 tablet Oral 2 times daily 06/21/22 1101 06/24/22 2359   06/20/22 0700  valACYclovir (VALTREX) tablet 500 mg        500 mg Oral Every morning 06/19/22 1141     06/18/22 2230  nirmatrelvir/ritonavir EUA (PAXLOVID) 3 tablet        3 tablet Oral 2 times daily 06/18/22 2102 06/23/22 1036        Subjective: Patient seen and examined at bedside.  No overnight fever, seizures or agitation reported.  Objective: Vitals:  06/23/22 2016 06/24/22 1326 06/24/22 2154 06/25/22 0636  BP: (!) 124/93 114/79 (!) 156/85 111/73  Pulse: 86 99 (!) 101 73  Resp: '16 20 18 17  '$ Temp:  97.8 F (36.6 C) 98.3 F (36.8 C) 98.1 F (36.7 C)  TempSrc:  Oral Oral Oral  SpO2: 95% 96% 94% 96%  Weight:      Height:        Intake/Output Summary (Last 24 hours) at 06/25/2022 1054 Last data filed at 06/25/2022 0900 Gross per 24 hour   Intake 720 ml  Output 600 ml  Net 120 ml   Filed Weights   06/18/22 1532 06/18/22 2232 06/22/22 0532  Weight: 88.5 kg 87.2 kg 85.7 kg    Examination:  General exam: Appears calm and comfortable.  Currently on room air.  No distress.  Slow to respond.  Poor historian.  Looks chronically ill and deconditioned Respiratory system: Bilateral decreased breath sounds at bases with some scattered crackles Cardiovascular system: S1 & S2 heard, mildly tachycardic intermittently  gastrointestinal system: Abdomen is nondistended, soft and nontender. Normal bowel sounds heard. Extremities: No cyanosis, clubbing; trace lower extremity edema present    Data Reviewed: I have personally reviewed following labs and imaging studies  CBC: Recent Labs  Lab 06/18/22 1604 06/19/22 0418  WBC 4.3 4.9  NEUTROABS 2.1 2.5  HGB 14.5 13.5  HCT 41.2 39.6  MCV 91.4 95.0  PLT 133* 253*   Basic Metabolic Panel: Recent Labs  Lab 06/18/22 1604 06/19/22 0409 06/19/22 0418 06/20/22 0431 06/23/22 0403  NA 136  --  137 136 141  K 3.9  --  3.3* 3.9 3.7  CL 104  --  104 105 109  CO2 19*  --  '25 24 22  '$ GLUCOSE 97  --  98 97 98  BUN 11  --  '13 10 14  '$ CREATININE 0.96  --  0.91 0.91 0.75  CALCIUM 8.9  --  8.3* 8.3* 8.7*  MG  --  1.9  --   --  2.1   GFR: Estimated Creatinine Clearance: 86.3 mL/min (by C-G formula based on SCr of 0.75 mg/dL). Liver Function Tests: Recent Labs  Lab 06/18/22 1604 06/19/22 0418  AST 37 34  ALT 30 28  ALKPHOS 52 51  BILITOT 1.2 0.8  PROT 7.4 6.5  ALBUMIN 4.3 3.8   No results for input(s): "LIPASE", "AMYLASE" in the last 168 hours. Recent Labs  Lab 06/18/22 1604  AMMONIA 21   Coagulation Profile: No results for input(s): "INR", "PROTIME" in the last 168 hours. Cardiac Enzymes: No results for input(s): "CKTOTAL", "CKMB", "CKMBINDEX", "TROPONINI" in the last 168 hours. BNP (last 3 results) No results for input(s): "PROBNP" in the last 8760 hours. HbA1C: No  results for input(s): "HGBA1C" in the last 72 hours. CBG: Recent Labs  Lab 06/18/22 1517  GLUCAP 101*   Lipid Profile: No results for input(s): "CHOL", "HDL", "LDLCALC", "TRIG", "CHOLHDL", "LDLDIRECT" in the last 72 hours. Thyroid Function Tests: No results for input(s): "TSH", "T4TOTAL", "FREET4", "T3FREE", "THYROIDAB" in the last 72 hours. Anemia Panel: No results for input(s): "VITAMINB12", "FOLATE", "FERRITIN", "TIBC", "IRON", "RETICCTPCT" in the last 72 hours. Sepsis Labs: Recent Labs  Lab 06/18/22 2141  PROCALCITON <0.10    Recent Results (from the past 240 hour(s))  Resp Panel by RT-PCR (Flu A&B, Covid) Anterior Nasal Swab     Status: Abnormal   Collection Time: 06/18/22  4:04 PM   Specimen: Anterior Nasal Swab  Result Value Ref Range Status  SARS Coronavirus 2 by RT PCR POSITIVE (A) NEGATIVE Final    Comment: (NOTE) SARS-CoV-2 target nucleic acids are DETECTED.  The SARS-CoV-2 RNA is generally detectable in upper respiratory specimens during the acute phase of infection. Positive results are indicative of the presence of the identified virus, but do not rule out bacterial infection or co-infection with other pathogens not detected by the test. Clinical correlation with patient history and other diagnostic information is necessary to determine patient infection status. The expected result is Negative.  Fact Sheet for Patients: EntrepreneurPulse.com.au  Fact Sheet for Healthcare Providers: IncredibleEmployment.be  This test is not yet approved or cleared by the [place] FDA and  has been authorized for detection and/or diagnosis of SARS-CoV-2 by FDA under an Emergency Use Authorization (EUA).  This EUA will remain in effect (meaning this test can be used) for the duration of  the COVID-19 declaration under Section 564(b)(1) of the A ct, 21 U.S.C. section 360bbb-3(b)(1), unless the authorization is terminated or revoked  sooner.     Influenza A by PCR NEGATIVE NEGATIVE Final   Influenza B by PCR NEGATIVE NEGATIVE Final    Comment: (NOTE) The Xpert Xpress SARS-CoV-2/FLU/RSV plus assay is intended as an aid in the diagnosis of influenza from Nasopharyngeal swab specimens and should not be used as a sole basis for treatment. Nasal washings and aspirates are unacceptable for Xpert Xpress SARS-CoV-2/FLU/RSV testing.  Fact Sheet for Patients: EntrepreneurPulse.com.au  Fact Sheet for Healthcare Providers: IncredibleEmployment.be  This test is not yet approved or cleared by the [place] FDA and has been authorized for detection and/or diagnosis of SARS-CoV-2 by FDA under an Emergency Use Authorization (EUA). This EUA will remain in effect (meaning this test can be used) for the duration of the COVID-19 declaration under Section 564(b)(1) of the Act, 21 U.S.C. section 360bbb-3(b)(1), unless the authorization is terminated or revoked.  Performed at [facility], [place] [address], [place], [place] [address]          Radiology Studies: No results found.      Scheduled Meds:  brimonidine  1 drop Both Eyes BID   enoxaparin (LOVENOX) injection  40 mg Subcutaneous QHS   folic acid  1 mg Oral Daily   levETIRAcetam  500 mg Oral BID   levothyroxine  37.5 mcg Oral Q0600   loratadine  10 mg Oral Daily   montelukast  10 mg Oral QHS   multivitamin with minerals  1 tablet Oral Daily   QUEtiapine  25 mg Oral QHS   tamsulosin  0.4 mg Oral Daily   thiamine  100 mg Oral Daily   Or   thiamine  100 mg Intravenous Daily   timolol  1 drop Both Eyes BID   traZODone  25 mg Oral QHS   valACYclovir  500 mg Oral q AM   Continuous Infusions:        Aline August, MD [facility] 06/25/2022, 10:54 AM

## 2022-06-25 NOTE — Plan of Care (Signed)

## 2022-06-25 NOTE — Progress Notes (Signed)
Occupational Therapy Treatment Patient Details Name: Robert Cummings. MRN: [id number] DOB: 1946-06-11 Today's Date: 06/25/2022   History of present illness Pt is a 76 year old male presenting with acute metabolic encephalopathy, AMS and COVID. EEG was nonspecific, but showed mild diffuse metabolic encephalopathy. Pt was intubated in ER on 8/15. Pt self extubated on 8/17. Relevant PMH includes HLD, hypothyroidism, depression,  seizure, acute metabolic encephalopathy, and hx of TBI.   OT comments  Pt participated in OT ADL retraining session with focus on bed mobility, safety with DME/RW, ADL's for grooming/bathing, transfers & UB dressing as well as lunch set-up & eating. Pt level of confusion is currently a limiting factor as he was disoriented to place, time and situation and was perseverating family members (not present) and required consistent redirection to tasks. He is currently min guard assist for transfers and LB bathing. Will continue to follow acutely toward acute OT plan of care and goals.   Recommendations for follow up therapy are one component of a multi-disciplinary discharge planning process, led by the attending physician.  Recommendations may be updated based on patient status, additional functional criteria and insurance authorization.    Follow Up Recommendations  No OT follow up    Assistance Recommended at Discharge Frequent or constant Supervision/Assistance  Patient can return home with the following  A little help with walking and/or transfers;A little help with bathing/dressing/bathroom;Assistance with cooking/housework;Direct supervision/assist for financial management;Assist for transportation;Direct supervision/assist for medications management   Equipment Recommendations  None recommended by OT    Recommendations for Other Services      Precautions / Restrictions Precautions Precautions: Fall Precaution Comments: Airborn/Contact Restrictions Weight  Bearing Restrictions: No       Mobility Bed Mobility Overal bed mobility: Needs Assistance Bed Mobility: Supine to Sit, Sit to Supine     Supine to sit: HOB elevated, Supervision, Modified independent (Device/Increase time) Sit to supine: Supervision   General bed mobility comments: Increased time, cues for sequencing. Use of bed rails.    Transfers Overall transfer level: Needs assistance Equipment used: Rolling walker (2 wheels) Transfers: Sit to/from Stand, Bed to chair/wheelchair/BSC Sit to Stand: Min guard Stand pivot transfers: Min guard   Step pivot transfers: Min guard     General transfer comment: VC's for safety and sequencing with RW     Balance Overall balance assessment: History of Falls, Needs assistance Sitting-balance support: Bilateral upper extremity supported, No upper extremity supported, Feet supported Sitting balance-Leahy Scale: Good     Standing balance support: Bilateral upper extremity supported, During functional activity Standing balance-Leahy Scale: Fair (Can be impulsive)       ADL either performed or assessed with clinical judgement   ADL Overall ADL's : Needs assistance/impaired Eating/Feeding: Set up;Sitting;Supervision/ safety;Cueing for sequencing   Grooming: Wash/dry hands;Wash/dry face;Sitting;Set up   Upper Body Bathing: Set up;Supervision/ safety;Sitting Upper Body Bathing Details (indicate cue type and reason): Min VC's to initiate Lower Body Bathing: Sitting/lateral leans;Sit to/from stand;Min guard Lower Body Bathing Details (indicate cue type and reason): Min VC's to initiate Upper Body Dressing : Sitting;Set up;Supervision/safety     Toilet Transfer: Ambulation;Rolling walker (2 wheels);Min guard;Minimal assistance;BSC/3in1 Toilet Transfer Details (indicate cue type and reason): Simulated toilet transfer w/ Min guard assist sit to stand from EOB and taking several steps up to [facility]. Pt required Min VC's for safety and  sequencing as well as task initiation     Functional mobility during ADLs: Min guard;Rolling walker (2 wheels) General ADL Comments: Pt participated in  OT ADL retraining session with focus on bed mobility, safety with DME/RW, ADL's for grooming/bathing, transfers & UB dressing as well as lunch set-up & eating. Pt level of confusion is currently a limiting factor as he was disoriented to place, time and situation and was perseverating family members (not present) and required consistent redirection to tasks. He is currently min guard assist for transfers and LB bathing. Will continue to follow acutely toward acute OT plan of care and goals.    Extremity/Trunk Assessment Upper Extremity Assessment Upper Extremity Assessment: Overall WFL for tasks assessed   Lower Extremity Assessment Lower Extremity Assessment: Defer to PT evaluation   Cervical / Trunk Assessment Cervical / Trunk Assessment: Normal    Vision   Vision Assessment?: No apparent visual deficits          Cognition Arousal/Alertness: Awake/alert Behavior During Therapy: WFL for tasks assessed/performed Overall Cognitive Status: Impaired/Different from baseline Area of Impairment: Orientation, Memory, Attention, Awareness     Orientation Level: Disoriented to, Time, Situation Current Attention Level: Selective Memory: Decreased short-term memory     Awareness: Emergent   General Comments: No family present. Pt pleasantly confused. Disoriented to time and situation. Per RN, Pt's brother stated that pt's cognition is altered and pt is typically not as confused as he currently is. Pt with h/o dementia              General Comments Pt was disoriented to place and time/situation today and was perseverating on family that was not present, his car etc.    Pertinent Vitals/ Pain       Pain Assessment Pain Assessment: No/denies pain  Home Living  From ALF, plan to return to ALF      Prior Functioning/Environment    Please refer to initial OT Eval for details at ALF prior to this admission   Frequency  Min 2X/week        Progress Toward Goals  OT Goals(current goals can now be found in the care plan section)  Progress towards OT goals: Progressing toward goals  Acute Rehab OT Goals OT Goal Formulation: Patient unable to participate in goal setting Time For Goal Achievement: 07/04/22 Potential to Achieve Goals: [place] Discharge plan remains appropriate    AM-PAC OT "6 Clicks" Daily Activity     Outcome Measure   Help from another person eating meals?: A Little Help from another person taking care of personal grooming?: A Little Help from another person toileting, which includes using toliet, bedpan, or urinal?: A Little Help from another person bathing (including washing, rinsing, drying)?: A Little Help from another person to put on and taking off regular upper body clothing?: A Little Help from another person to put on and taking off regular lower body clothing?: A Little 6 Click Score: 18    End of Session Equipment Utilized During Treatment: Rolling walker (2 wheels)  OT Visit Diagnosis: Unsteadiness on feet (R26.81);Repeated falls (R29.6);History of falling (Z91.81);Other symptoms and signs involving cognitive function   Activity Tolerance Patient tolerated treatment well   Patient Left in bed;with call bell/phone within reach;with bed alarm set   Nurse Communication Mobility status;Other (comment) (Decreased safety with sit to stand transition)        Time: 9509-3267 OT Time Calculation (min): 29 min  Charges: OT General Charges $OT Visit: 1 Visit OT Treatments $Self Care/Home Management : 23-37 mins   Nakaya Mishkin Beth Dixon, OTR/L 06/25/2022, 12:20 PM

## 2022-06-26 DIAGNOSIS — G40909 Epilepsy, unspecified, not intractable, without status epilepticus: Secondary | ICD-10-CM | POA: Diagnosis not present

## 2022-06-26 DIAGNOSIS — U071 COVID-19: Secondary | ICD-10-CM | POA: Diagnosis not present

## 2022-06-26 DIAGNOSIS — G9341 Metabolic encephalopathy: Secondary | ICD-10-CM | POA: Diagnosis not present

## 2022-06-26 NOTE — TOC Progression Note (Addendum)
Transition of Care (TOC) - Progression Note    Patient Details  Name: Robert Cummings. MRN: [id number] Date of Birth: 09/24/1946  Transition of Care [facility]) CM/SW [place], RN Phone Number: 06/26/2022, 10:31 AM  Clinical Narrative:   Left message with [facility] to accept patient today. Awaiting a call back.  - 10:35am spoke with Alain Marion, RN at [facility] and patient can return back to the facility today. Report can be called to [place], [place].  Patient's brother Delfino Lovett reports [facility] will provide transportation. Spoke with Carnie at [employer] who will provide transportation home. Call CArnie at [phone], he will pick the patient up at the main entrance when called.   TOC will continue to follow.  Expected Discharge Plan:  ([facility]) Barriers to Discharge: Continued Medical Work up  Expected Discharge Plan and Services Expected Discharge Plan:  ([facility])   Discharge Planning Services: CM Consult   Living arrangements for the past 2 months: [place] Expected Discharge Date: 06/26/22                                     Social Determinants of Health (SDOH) Interventions    Readmission Risk Interventions     No data to display

## 2022-06-26 NOTE — Progress Notes (Signed)
Call placed to the patients brother to discuss discharge. No answer and unable to leave a voicemail. No changes from am assessment. Pt is out of bed self-1assist for safety, a&ox2 self and place.Carnie contacted to assist with transportation back to the facility [phone].

## 2022-06-26 NOTE — Discharge Summary (Signed)
Physician Discharge Summary  Robert D Greenwood Jr. DDU:[id number] DOB: 1946-09-29 DOA: 06/18/2022  PCP: Shelly Coss, MD  Admit date: 06/18/2022 Discharge date: 06/26/2022  Admitted From: ALF Disposition: ALF  Recommendations for Outpatient Follow-up:  Follow up with PCP in 1 week with repeat CBC/BMP Outpatient follow-up with neurology Recommend outpatient evaluation and follow-up by palliative care team for goals of care discussion. Follow up in ED if symptoms worsen or new appear   Home Health: No Equipment/Devices: None  Discharge Condition: Stable CODE STATUS: Full Diet recommendation: Heart healthy  Brief/Interim Summary: 76 y.o. male with medical history significant of chronic alcohol abuse, mild to moderate dementia, history of concussion/TBI in 2000, seizure disorder, depression, chronic thrombocytopenia, hyperlipidemia, hypothyroidism presented to the ED from his assisted living facility for evaluation of altered mental status/confusion.  Patient tested positive for COVID 2 days prior to admission at his facility.  Staff reported that patient is a daily alcohol user but has not had alcohol in a few days prior to this admission.  He was admitted for acute metabolic encephalopathy possibly from COVID-19 infection.  He was treated with Paxlovid.  Chest x-ray did not show any acute findings.  During the hospitalization, his condition has improved.  His mental status is back to baseline.  He will be discharged back to ALF today.  Outpatient follow-up with PCP.  Discharge Diagnoses:   Acute metabolic encephalopathy -Thought to be triggered by COVID-19 infection.  CT head was negative for acute findings.  No focal neurological deficits on examination. -TSH noted to be normal.  B12 level 462.  UA was unremarkable.   -Urine drug screen unremarkable.  Alcohol level less than 10. -Mentation is stable.  Patient is followed by neurology for his seizure disorder.  Patient also apparently  has a history of dementia.  Memantine was being considered by neurology as an outpatient but not initiated yet. -Seroquel started during this hospitalization because of sundowning.  Patient will follow-up with PCP regarding the need to continue Seroquel.  Will not discharge the patient on Seroquel.  Has not required Haldol in a few days. -Discharge patient back to ALF today.  Outpatient follow-up with PCP/neurology. -Consider outpatient palliative care evaluation and follow-up for goals of care discussion.   COVID-19 infection -Chest x-ray did not show any acute findings.  He was not hypoxic.  Does not have any respiratory symptoms.  CRP was only 2.9.  Given a 5-day course of Paxlovid.  There was no indication to initiate steroids.   Urinary retention -Foley catheter had to be placed overnight on 8/17.  Started on Flomax.   -Foley catheter was subsequently discontinued.  He has been able to void on his own.  Continue to monitor.   -Outpatient follow-up with urology    Hypokalemia -Resolved   Mild normal anion gap metabolic acidosis -Resolved.   Chronic alcohol abuse -No evidence of withdrawal.  Continue multivitamin, thiamine, folate.   Seizure disorder -Continue Keppra.   Depression -Continue with home medications.   Chronic thrombocytopenia -Stable.   Hyperlipidemia -Continue statin.   Hypothyroidism -TSH normal.  Continue levothyroxine.  Discharge Instructions  Discharge Instructions     Call MD for:  difficulty breathing, headache or visual disturbances   Complete by: As directed    Call MD for:  extreme fatigue   Complete by: As directed    Call MD for:  persistant dizziness or light-headedness   Complete by: As directed    Call MD for:  persistant nausea and vomiting  Complete by: As directed    Call MD for:  severe uncontrolled pain   Complete by: As directed    Call MD for:  temperature >100.4   Complete by: As directed    Diet - low sodium heart healthy    Complete by: As directed    Discharge instructions   Complete by: As directed    Please take your medications as prescribed.  Seek attention if you have difficulty urinating going forward.  You may need to be referred to urology.  You were cared for by a hospitalist during your hospital stay. If you have any questions about your discharge medications or the care you received while you were in the hospital after you are discharged, you can call the unit and asked to speak with the hospitalist on call if the hospitalist that took care of you is not available. Once you are discharged, your primary care physician will handle any further medical issues. Please note that NO REFILLS for any discharge medications will be authorized once you are discharged, as it is imperative that you return to your primary care physician (or establish a relationship with a primary care physician if you do not have one) for your aftercare needs so that they can reassess your need for medications and monitor your lab values. If you do not have a primary care physician, you can call [phone] for a physician referral.   Increase activity slowly   Complete by: As directed       Allergies as of 06/26/2022       Reactions   Cephalexin Other (See Comments)   Brother cannot confirm   Ciprofloxacin Other (See Comments)   Brother cannot confirm   Codeine Other (See Comments)   Brother cannot confirm   Other Other (See Comments)   Ragweed- Runny nose, itchy eyes, and congestion also        Medication List     TAKE these medications    acetaminophen 325 MG tablet Commonly known as: TYLENOL Take 650 mg by mouth in the morning.   Astepro 0.15 % Soln Generic drug: Azelastine HCl Place 2 sprays into both nostrils in the morning and at bedtime.   atorvastatin 20 MG tablet Commonly known as: LIPITOR Take 1 tablet (20 mg total) by mouth at bedtime. Can be resumed after patient has completed course of Paxlovid What  changed: additional instructions   brimonidine 0.2 % ophthalmic solution Commonly known as: ALPHAGAN Place 1 drop into both eyes 2 (two) times daily.   cetirizine 10 MG tablet Commonly known as: ZYRTEC Take 10 mg by mouth every evening.   folic acid 1 MG tablet Commonly known as: FOLVITE Take 1 tablet (1 mg total) by mouth daily.   levETIRAcetam 500 MG tablet Commonly known as: KEPPRA Take 1 tablet (500 mg total) by mouth 2 (two) times daily. What changed: when to take this   levothyroxine 25 MCG tablet Commonly known as: SYNTHROID Take 37.5 mcg by mouth daily before breakfast.   loperamide 2 MG tablet Commonly known as: IMODIUM A-D Take 2 mg by mouth 4 (four) times daily as needed for diarrhea or loose stools.   montelukast 10 MG tablet Commonly known as: SINGULAIR Take 10 mg by mouth at bedtime.   multivitamin with minerals Tabs tablet Take 1 tablet by mouth daily with breakfast.   ondansetron 4 MG tablet Commonly known as: ZOFRAN Take 4 mg by mouth every 8 (eight) hours as needed for nausea or vomiting.  potassium chloride SA 20 MEQ tablet Commonly known as: KLOR-CON M Take 1 tablet (20 mEq total) by mouth daily. Take 40 mg (2 pills) daily  for 5 days then continue one pill daily What changed:  when to take this additional instructions   sertraline 50 MG tablet Commonly known as: ZOLOFT Take 2 tablets (100 mg total) by mouth in the morning. Can be resumed after patient has completed course of Paxlovid What changed: additional instructions   tamsulosin 0.4 MG Caps capsule Commonly known as: FLOMAX Take 1 capsule (0.4 mg total) by mouth daily.   thiamine 100 MG tablet Commonly known as: VITAMIN B1 Take 1 tablet (100 mg total) by mouth daily.   timolol 0.5 % ophthalmic solution Commonly known as: TIMOPTIC Place 1 drop into both eyes 2 (two) times daily.   traZODone 50 MG tablet Commonly known as: DESYREL Take 25 mg by mouth at bedtime.   valACYclovir  500 MG tablet Commonly known as: VALTREX Take 500 mg by mouth in the morning.          Allergies  Allergen Reactions   Cephalexin Other (See Comments)    Brother cannot confirm   Ciprofloxacin Other (See Comments)    Brother cannot confirm   Codeine Other (See Comments)    Brother cannot confirm   Other Other (See Comments)    Ragweed- Runny nose, itchy eyes, and congestion also    Consultations: None   Procedures/Studies: CT Head Wo Contrast  Result Date: 06/18/2022 CLINICAL DATA:  Mental status EXAM: CT HEAD WITHOUT CONTRAST TECHNIQUE: Contiguous axial images were obtained from the base of the skull through the vertex without intravenous contrast. RADIATION DOSE REDUCTION: This exam was performed according to the departmental dose-optimization program which includes automated exposure control, adjustment of the mA and/or kV according to patient size and/or use of iterative reconstruction technique. COMPARISON:  CT head 1 day prior FINDINGS: Brain: There is no acute intracranial hemorrhage, extra-axial fluid collection, or acute infarct. Parenchymal volume is normal. The ventricles are normal in size. Gray-white differentiation is preserved. There is no mass lesion.  There is no mass effect or midline shift. Vascular: No hyperdense vessel or unexpected calcification. Skull: Normal. Negative for fracture or focal lesion. Sinuses/Orbits: There is a small amount of layering fluid in the bilateral maxillary sinuses. Bilateral lens implants are in place. The globes and orbits are otherwise unremarkable. Other: A presumed stimulator device is again seen overlying the frontal bone. IMPRESSION: Stable head CT with no acute intracranial pathology. Electronically Signed   By: Valetta Mole M.D.   On: 06/18/2022 16:34   DG Chest Portable 1 View  Result Date: 06/18/2022 CLINICAL DATA:  Altered mental status.  COVID positive. EXAM: PORTABLE CHEST 1 VIEW COMPARISON:  06/19/2019 FINDINGS: Moderate  right hemidiaphragm elevation is chronic. Battery for brain stimulator projects over the left chest. Midline trachea. Normal heart size for level of inspiration. Atherosclerosis in the transverse aorta. No pleural effusion or pneumothorax. Right greater than left base scarring and/or atelectasis, relatively similar. No lobar consolidation. IMPRESSION: No acute cardiopulmonary disease. Moderate right hemidiaphragm elevation with similar bibasilar scarring/atelectasis. Aortic Atherosclerosis (ICD10-I70.0). Electronically Signed   By: Abigail Miyamoto M.D.   On: 06/18/2022 16:05      Subjective: Patient seen and examined at bedside.  Poor historian.  No agitation, fever or seizures reported.  Discharge Exam: Vitals:   06/25/22 1344 06/25/22 2133  BP: (!) 140/89 (!) 140/89  Pulse: 62 98  Resp: 16 17  Temp: 98.2 F (36.8 C) 98.3 F (36.8 C)  SpO2: 96% 94%    General: Pt is awake, not in acute distress.  Looks chronically ill and deconditioned.  Currently on room air.  Slow to respond.  Poor historian. Cardiovascular: rate controlled, S1/S2 + Respiratory: bilateral decreased breath sounds at bases with some scattered crackles Abdominal: Soft, NT, ND, bowel sounds + Extremities: Trace extremity edema; no cyanosis    The results of significant diagnostics from this hospitalization (including imaging, microbiology, ancillary and laboratory) are listed below for reference.     Microbiology: Recent Results (from the past 240 hour(s))  Resp Panel by RT-PCR (Flu A&B, Covid) Anterior Nasal Swab     Status: Abnormal   Collection Time: 06/18/22  4:04 PM   Specimen: Anterior Nasal Swab  Result Value Ref Range Status   SARS Coronavirus 2 by RT PCR POSITIVE (A) NEGATIVE Final    Comment: (NOTE) SARS-CoV-2 target nucleic acids are DETECTED.  The SARS-CoV-2 RNA is generally detectable in upper respiratory specimens during the acute phase of infection. Positive results are indicative of the presence  of the identified virus, but do not rule out bacterial infection or co-infection with other pathogens not detected by the test. Clinical correlation with patient history and other diagnostic information is necessary to determine patient infection status. The expected result is Negative.  Fact Sheet for Patients: EntrepreneurPulse.com.au  Fact Sheet for Healthcare Providers: IncredibleEmployment.be  This test is not yet approved or cleared by the [place] FDA and  has been authorized for detection and/or diagnosis of SARS-CoV-2 by FDA under an Emergency Use Authorization (EUA).  This EUA will remain in effect (meaning this test can be used) for the duration of  the COVID-19 declaration under Section 564(b)(1) of the A ct, 21 U.S.C. section 360bbb-3(b)(1), unless the authorization is terminated or revoked sooner.     Influenza A by PCR NEGATIVE NEGATIVE Final   Influenza B by PCR NEGATIVE NEGATIVE Final    Comment: (NOTE) The Xpert Xpress SARS-CoV-2/FLU/RSV plus assay is intended as an aid in the diagnosis of influenza from Nasopharyngeal swab specimens and should not be used as a sole basis for treatment. Nasal washings and aspirates are unacceptable for Xpert Xpress SARS-CoV-2/FLU/RSV testing.  Fact Sheet for Patients: EntrepreneurPulse.com.au  Fact Sheet for Healthcare Providers: IncredibleEmployment.be  This test is not yet approved or cleared by the [place] FDA and has been authorized for detection and/or diagnosis of SARS-CoV-2 by FDA under an Emergency Use Authorization (EUA). This EUA will remain in effect (meaning this test can be used) for the duration of the COVID-19 declaration under Section 564(b)(1) of the Act, 21 U.S.C. section 360bbb-3(b)(1), unless the authorization is terminated or revoked.  Performed at [facility], [place] [address] [place], [place]  [address]      Labs: BNP (last 3 results) No results for input(s): "BNP" in the last 8760 hours. Basic Metabolic Panel: Recent Labs  Lab 06/20/22 0431 06/23/22 0403  NA 136 141  K 3.9 3.7  CL 105 109  CO2 24 22  GLUCOSE 97 98  BUN 10 14  CREATININE 0.91 0.75  CALCIUM 8.3* 8.7*  MG  --  2.1   Liver Function Tests: No results for input(s): "AST", "ALT", "ALKPHOS", "BILITOT", "PROT", "ALBUMIN" in the last 168 hours. No results for input(s): "LIPASE", "AMYLASE" in the last 168 hours. No results for input(s): "AMMONIA" in the last 168 hours. CBC: No results for input(s): "WBC", "NEUTROABS", "HGB", "HCT", "  MCV", "PLT" in the last 168 hours. Cardiac Enzymes: No results for input(s): "CKTOTAL", "CKMB", "CKMBINDEX", "TROPONINI" in the last 168 hours. BNP: Invalid input(s): "POCBNP" CBG: No results for input(s): "GLUCAP" in the last 168 hours. D-Dimer No results for input(s): "DDIMER" in the last 72 hours. Hgb A1c No results for input(s): "HGBA1C" in the last 72 hours. Lipid Profile No results for input(s): "CHOL", "HDL", "LDLCALC", "TRIG", "CHOLHDL", "LDLDIRECT" in the last 72 hours. Thyroid function studies No results for input(s): "TSH", "T4TOTAL", "T3FREE", "THYROIDAB" in the last 72 hours.  Invalid input(s): "FREET3" Anemia work up No results for input(s): "VITAMINB12", "FOLATE", "FERRITIN", "TIBC", "IRON", "RETICCTPCT" in the last 72 hours. Urinalysis    Component Value Date/Time   COLORURINE YELLOW 06/19/2022 0723   APPEARANCEUR CLEAR 06/19/2022 0723   LABSPEC 1.008 06/19/2022 0723   PHURINE 6.0 06/19/2022 0723   GLUCOSEU NEGATIVE 06/19/2022 0723   HGBUR NEGATIVE 06/19/2022 0723   BILIRUBINUR NEGATIVE 06/19/2022 0723   KETONESUR NEGATIVE 06/19/2022 0723   PROTEINUR NEGATIVE 06/19/2022 0723   NITRITE NEGATIVE 06/19/2022 0723   LEUKOCYTESUR NEGATIVE 06/19/2022 0723   Sepsis Labs No results for input(s): "WBC" in the last 168 hours.  Invalid input(s):  "PROCALCITONIN", "LACTICIDVEN" Microbiology Recent Results (from the past 240 hour(s))  Resp Panel by RT-PCR (Flu A&B, Covid) Anterior Nasal Swab     Status: Abnormal   Collection Time: 06/18/22  4:04 PM   Specimen: Anterior Nasal Swab  Result Value Ref Range Status   SARS Coronavirus 2 by RT PCR POSITIVE (A) NEGATIVE Final    Comment: (NOTE) SARS-CoV-2 target nucleic acids are DETECTED.  The SARS-CoV-2 RNA is generally detectable in upper respiratory specimens during the acute phase of infection. Positive results are indicative of the presence of the identified virus, but do not rule out bacterial infection or co-infection with other pathogens not detected by the test. Clinical correlation with patient history and other diagnostic information is necessary to determine patient infection status. The expected result is Negative.  Fact Sheet for Patients: EntrepreneurPulse.com.au  Fact Sheet for Healthcare Providers: IncredibleEmployment.be  This test is not yet approved or cleared by the [place] FDA and  has been authorized for detection and/or diagnosis of SARS-CoV-2 by FDA under an Emergency Use Authorization (EUA).  This EUA will remain in effect (meaning this test can be used) for the duration of  the COVID-19 declaration under Section 564(b)(1) of the A ct, 21 U.S.C. section 360bbb-3(b)(1), unless the authorization is terminated or revoked sooner.     Influenza A by PCR NEGATIVE NEGATIVE Final   Influenza B by PCR NEGATIVE NEGATIVE Final    Comment: (NOTE) The Xpert Xpress SARS-CoV-2/FLU/RSV plus assay is intended as an aid in the diagnosis of influenza from Nasopharyngeal swab specimens and should not be used as a sole basis for treatment. Nasal washings and aspirates are unacceptable for Xpert Xpress SARS-CoV-2/FLU/RSV testing.  Fact Sheet for Patients: EntrepreneurPulse.com.au  Fact Sheet for Healthcare  Providers: IncredibleEmployment.be  This test is not yet approved or cleared by the [place] FDA and has been authorized for detection and/or diagnosis of SARS-CoV-2 by FDA under an Emergency Use Authorization (EUA). This EUA will remain in effect (meaning this test can be used) for the duration of the COVID-19 declaration under Section 564(b)(1) of the Act, 21 U.S.C. section 360bbb-3(b)(1), unless the authorization is terminated or revoked.  Performed at [facility], [place] [address] [place], [place] [address]      Time coordinating discharge: 4  minutes  SIGNED:   Aline August, MD  [facility] 06/26/2022, 10:58 AM

## 2023-01-07 DIAGNOSIS — R41841 Cognitive communication deficit: Secondary | ICD-10-CM | POA: Diagnosis not present

## 2023-01-07 DIAGNOSIS — R488 Other symbolic dysfunctions: Secondary | ICD-10-CM | POA: Diagnosis not present

## 2023-01-08 DIAGNOSIS — R41841 Cognitive communication deficit: Secondary | ICD-10-CM | POA: Diagnosis not present

## 2023-01-09 DIAGNOSIS — R41841 Cognitive communication deficit: Secondary | ICD-10-CM | POA: Diagnosis not present

## 2023-01-09 DIAGNOSIS — R488 Other symbolic dysfunctions: Secondary | ICD-10-CM | POA: Diagnosis not present

## 2023-01-12 ENCOUNTER — Ambulatory Visit: Payer: Medicare HMO | Admitting: Diagnostic Neuroimaging

## 2023-01-14 DIAGNOSIS — R41841 Cognitive communication deficit: Secondary | ICD-10-CM | POA: Diagnosis not present

## 2023-01-15 DIAGNOSIS — R488 Other symbolic dysfunctions: Secondary | ICD-10-CM | POA: Diagnosis not present

## 2023-01-15 DIAGNOSIS — R41841 Cognitive communication deficit: Secondary | ICD-10-CM | POA: Diagnosis not present

## 2023-01-16 DIAGNOSIS — R41841 Cognitive communication deficit: Secondary | ICD-10-CM | POA: Diagnosis not present

## 2023-01-16 DIAGNOSIS — R488 Other symbolic dysfunctions: Secondary | ICD-10-CM | POA: Diagnosis not present

## 2023-01-20 DIAGNOSIS — R41841 Cognitive communication deficit: Secondary | ICD-10-CM | POA: Diagnosis not present

## 2023-01-21 DIAGNOSIS — R488 Other symbolic dysfunctions: Secondary | ICD-10-CM | POA: Diagnosis not present

## 2023-01-21 DIAGNOSIS — R41841 Cognitive communication deficit: Secondary | ICD-10-CM | POA: Diagnosis not present

## 2023-01-23 DIAGNOSIS — R41841 Cognitive communication deficit: Secondary | ICD-10-CM | POA: Diagnosis not present

## 2023-01-23 DIAGNOSIS — R488 Other symbolic dysfunctions: Secondary | ICD-10-CM | POA: Diagnosis not present

## 2023-01-28 DIAGNOSIS — R488 Other symbolic dysfunctions: Secondary | ICD-10-CM | POA: Diagnosis not present

## 2023-01-28 DIAGNOSIS — R41841 Cognitive communication deficit: Secondary | ICD-10-CM | POA: Diagnosis not present

## 2023-01-29 DIAGNOSIS — R41841 Cognitive communication deficit: Secondary | ICD-10-CM | POA: Diagnosis not present

## 2023-01-30 DIAGNOSIS — R488 Other symbolic dysfunctions: Secondary | ICD-10-CM | POA: Diagnosis not present

## 2023-01-30 DIAGNOSIS — R41841 Cognitive communication deficit: Secondary | ICD-10-CM | POA: Diagnosis not present

## 2023-02-02 DIAGNOSIS — R41840 Attention and concentration deficit: Secondary | ICD-10-CM | POA: Diagnosis not present

## 2023-02-02 DIAGNOSIS — R488 Other symbolic dysfunctions: Secondary | ICD-10-CM | POA: Diagnosis not present

## 2023-02-02 DIAGNOSIS — R41841 Cognitive communication deficit: Secondary | ICD-10-CM | POA: Diagnosis not present

## 2023-02-04 DIAGNOSIS — R488 Other symbolic dysfunctions: Secondary | ICD-10-CM | POA: Diagnosis not present

## 2023-02-04 DIAGNOSIS — R41840 Attention and concentration deficit: Secondary | ICD-10-CM | POA: Diagnosis not present

## 2023-02-04 DIAGNOSIS — R41841 Cognitive communication deficit: Secondary | ICD-10-CM | POA: Diagnosis not present

## 2023-02-06 DIAGNOSIS — R41841 Cognitive communication deficit: Secondary | ICD-10-CM | POA: Diagnosis not present

## 2023-02-06 DIAGNOSIS — R488 Other symbolic dysfunctions: Secondary | ICD-10-CM | POA: Diagnosis not present

## 2023-02-10 DIAGNOSIS — R488 Other symbolic dysfunctions: Secondary | ICD-10-CM | POA: Diagnosis not present

## 2023-02-10 DIAGNOSIS — R41840 Attention and concentration deficit: Secondary | ICD-10-CM | POA: Diagnosis not present

## 2023-02-10 DIAGNOSIS — R41841 Cognitive communication deficit: Secondary | ICD-10-CM | POA: Diagnosis not present

## 2023-02-11 DIAGNOSIS — R41841 Cognitive communication deficit: Secondary | ICD-10-CM | POA: Diagnosis not present

## 2023-02-11 DIAGNOSIS — R488 Other symbolic dysfunctions: Secondary | ICD-10-CM | POA: Diagnosis not present

## 2023-02-12 DIAGNOSIS — R41841 Cognitive communication deficit: Secondary | ICD-10-CM | POA: Diagnosis not present

## 2023-02-12 DIAGNOSIS — R488 Other symbolic dysfunctions: Secondary | ICD-10-CM | POA: Diagnosis not present

## 2023-02-12 DIAGNOSIS — R41840 Attention and concentration deficit: Secondary | ICD-10-CM | POA: Diagnosis not present

## 2023-02-13 DIAGNOSIS — R41841 Cognitive communication deficit: Secondary | ICD-10-CM | POA: Diagnosis not present

## 2023-02-13 DIAGNOSIS — R488 Other symbolic dysfunctions: Secondary | ICD-10-CM | POA: Diagnosis not present

## 2023-02-13 DIAGNOSIS — R41840 Attention and concentration deficit: Secondary | ICD-10-CM | POA: Diagnosis not present

## 2023-02-17 DIAGNOSIS — R41841 Cognitive communication deficit: Secondary | ICD-10-CM | POA: Diagnosis not present

## 2023-02-17 DIAGNOSIS — R41840 Attention and concentration deficit: Secondary | ICD-10-CM | POA: Diagnosis not present

## 2023-02-17 DIAGNOSIS — R488 Other symbolic dysfunctions: Secondary | ICD-10-CM | POA: Diagnosis not present

## 2023-02-19 DIAGNOSIS — R41841 Cognitive communication deficit: Secondary | ICD-10-CM | POA: Diagnosis not present

## 2023-02-19 DIAGNOSIS — R488 Other symbolic dysfunctions: Secondary | ICD-10-CM | POA: Diagnosis not present

## 2023-02-20 DIAGNOSIS — R488 Other symbolic dysfunctions: Secondary | ICD-10-CM | POA: Diagnosis not present

## 2023-02-20 DIAGNOSIS — R41841 Cognitive communication deficit: Secondary | ICD-10-CM | POA: Diagnosis not present

## 2023-02-24 DIAGNOSIS — R488 Other symbolic dysfunctions: Secondary | ICD-10-CM | POA: Diagnosis not present

## 2023-02-24 DIAGNOSIS — R41841 Cognitive communication deficit: Secondary | ICD-10-CM | POA: Diagnosis not present

## 2023-02-24 DIAGNOSIS — R41840 Attention and concentration deficit: Secondary | ICD-10-CM | POA: Diagnosis not present

## 2023-02-26 DIAGNOSIS — R41841 Cognitive communication deficit: Secondary | ICD-10-CM | POA: Diagnosis not present

## 2023-02-26 DIAGNOSIS — Z79899 Other long term (current) drug therapy: Secondary | ICD-10-CM | POA: Diagnosis not present

## 2023-02-26 DIAGNOSIS — R488 Other symbolic dysfunctions: Secondary | ICD-10-CM | POA: Diagnosis not present

## 2023-02-26 DIAGNOSIS — E559 Vitamin D deficiency, unspecified: Secondary | ICD-10-CM | POA: Diagnosis not present

## 2023-02-26 DIAGNOSIS — R41840 Attention and concentration deficit: Secondary | ICD-10-CM | POA: Diagnosis not present

## 2023-02-27 DIAGNOSIS — R41840 Attention and concentration deficit: Secondary | ICD-10-CM | POA: Diagnosis not present

## 2023-02-27 DIAGNOSIS — R488 Other symbolic dysfunctions: Secondary | ICD-10-CM | POA: Diagnosis not present

## 2023-02-27 DIAGNOSIS — R41841 Cognitive communication deficit: Secondary | ICD-10-CM | POA: Diagnosis not present

## 2023-03-03 DIAGNOSIS — R41841 Cognitive communication deficit: Secondary | ICD-10-CM | POA: Diagnosis not present

## 2023-03-03 DIAGNOSIS — R41840 Attention and concentration deficit: Secondary | ICD-10-CM | POA: Diagnosis not present

## 2023-03-03 DIAGNOSIS — R488 Other symbolic dysfunctions: Secondary | ICD-10-CM | POA: Diagnosis not present

## 2023-03-09 DIAGNOSIS — R41840 Attention and concentration deficit: Secondary | ICD-10-CM | POA: Diagnosis not present

## 2023-03-09 DIAGNOSIS — R488 Other symbolic dysfunctions: Secondary | ICD-10-CM | POA: Diagnosis not present

## 2023-03-17 DIAGNOSIS — R41840 Attention and concentration deficit: Secondary | ICD-10-CM | POA: Diagnosis not present

## 2023-03-17 DIAGNOSIS — R488 Other symbolic dysfunctions: Secondary | ICD-10-CM | POA: Diagnosis not present

## 2023-03-19 DIAGNOSIS — R488 Other symbolic dysfunctions: Secondary | ICD-10-CM | POA: Diagnosis not present

## 2023-03-19 DIAGNOSIS — G40409 Other generalized epilepsy and epileptic syndromes, not intractable, without status epilepticus: Secondary | ICD-10-CM | POA: Diagnosis not present

## 2023-03-19 DIAGNOSIS — R41840 Attention and concentration deficit: Secondary | ICD-10-CM | POA: Diagnosis not present

## 2023-03-19 DIAGNOSIS — F331 Major depressive disorder, recurrent, moderate: Secondary | ICD-10-CM | POA: Diagnosis not present

## 2023-03-19 DIAGNOSIS — E782 Mixed hyperlipidemia: Secondary | ICD-10-CM | POA: Diagnosis not present

## 2023-03-19 DIAGNOSIS — F0392 Unspecified dementia, unspecified severity, with psychotic disturbance: Secondary | ICD-10-CM | POA: Diagnosis not present

## 2023-07-14 ENCOUNTER — Encounter: Payer: Self-pay | Admitting: Diagnostic Neuroimaging

## 2023-07-14 ENCOUNTER — Ambulatory Visit: Payer: Medicare HMO | Admitting: Diagnostic Neuroimaging
